# Patient Record
Sex: Male | Born: 1940
Health system: Southern US, Community
[De-identification: ages and names within clinical notes are randomized; demographics above are authoritative.]

## PROBLEM LIST (undated history)

## (undated) DIAGNOSIS — E119 Type 2 diabetes mellitus without complications: Secondary | ICD-10-CM

## (undated) DIAGNOSIS — N189 Chronic kidney disease, unspecified: Secondary | ICD-10-CM

## (undated) DIAGNOSIS — D649 Anemia, unspecified: Secondary | ICD-10-CM

## (undated) DIAGNOSIS — I10 Essential (primary) hypertension: Secondary | ICD-10-CM

---

## 2006-06-06 HISTORY — PX: SHOULDER ARTHROSCOPY: SHX128

## 2014-06-06 HISTORY — PX: KNEE ARTHROSCOPY: SUR90

## 2014-07-14 DIAGNOSIS — M1712 Unilateral primary osteoarthritis, left knee: Secondary | ICD-10-CM | POA: Diagnosis not present

## 2014-07-21 DIAGNOSIS — S83242A Other tear of medial meniscus, current injury, left knee, initial encounter: Secondary | ICD-10-CM | POA: Diagnosis not present

## 2014-08-06 DIAGNOSIS — Z6827 Body mass index (BMI) 27.0-27.9, adult: Secondary | ICD-10-CM | POA: Diagnosis not present

## 2014-08-06 DIAGNOSIS — Z79899 Other long term (current) drug therapy: Secondary | ICD-10-CM | POA: Diagnosis not present

## 2014-08-06 DIAGNOSIS — M23332 Other meniscus derangements, other medial meniscus, left knee: Secondary | ICD-10-CM | POA: Diagnosis not present

## 2014-08-06 DIAGNOSIS — M25562 Pain in left knee: Secondary | ICD-10-CM | POA: Diagnosis not present

## 2014-08-06 DIAGNOSIS — I10 Essential (primary) hypertension: Secondary | ICD-10-CM | POA: Diagnosis not present

## 2014-08-06 DIAGNOSIS — E119 Type 2 diabetes mellitus without complications: Secondary | ICD-10-CM | POA: Diagnosis not present

## 2014-08-15 DIAGNOSIS — Z6827 Body mass index (BMI) 27.0-27.9, adult: Secondary | ICD-10-CM | POA: Diagnosis not present

## 2014-08-15 DIAGNOSIS — Z79899 Other long term (current) drug therapy: Secondary | ICD-10-CM | POA: Diagnosis not present

## 2014-08-15 DIAGNOSIS — S83242A Other tear of medial meniscus, current injury, left knee, initial encounter: Secondary | ICD-10-CM | POA: Diagnosis not present

## 2014-08-15 DIAGNOSIS — M23332 Other meniscus derangements, other medial meniscus, left knee: Secondary | ICD-10-CM | POA: Diagnosis not present

## 2014-08-15 DIAGNOSIS — E119 Type 2 diabetes mellitus without complications: Secondary | ICD-10-CM | POA: Diagnosis not present

## 2014-08-15 DIAGNOSIS — M25562 Pain in left knee: Secondary | ICD-10-CM | POA: Diagnosis not present

## 2014-08-15 DIAGNOSIS — I10 Essential (primary) hypertension: Secondary | ICD-10-CM | POA: Diagnosis not present

## 2014-08-19 DIAGNOSIS — Z01810 Encounter for preprocedural cardiovascular examination: Secondary | ICD-10-CM | POA: Diagnosis not present

## 2014-08-19 DIAGNOSIS — I517 Cardiomegaly: Secondary | ICD-10-CM | POA: Diagnosis not present

## 2014-09-04 DIAGNOSIS — H04129 Dry eye syndrome of unspecified lacrimal gland: Secondary | ICD-10-CM | POA: Diagnosis not present

## 2014-09-04 DIAGNOSIS — E11319 Type 2 diabetes mellitus with unspecified diabetic retinopathy without macular edema: Secondary | ICD-10-CM | POA: Diagnosis not present

## 2014-09-04 DIAGNOSIS — H2510 Age-related nuclear cataract, unspecified eye: Secondary | ICD-10-CM | POA: Diagnosis not present

## 2014-09-08 DIAGNOSIS — J019 Acute sinusitis, unspecified: Secondary | ICD-10-CM | POA: Diagnosis not present

## 2014-09-08 DIAGNOSIS — J301 Allergic rhinitis due to pollen: Secondary | ICD-10-CM | POA: Diagnosis not present

## 2014-09-12 DIAGNOSIS — E119 Type 2 diabetes mellitus without complications: Secondary | ICD-10-CM | POA: Diagnosis not present

## 2014-09-12 DIAGNOSIS — D152 Benign neoplasm of mediastinum: Secondary | ICD-10-CM | POA: Diagnosis not present

## 2014-09-12 DIAGNOSIS — I10 Essential (primary) hypertension: Secondary | ICD-10-CM | POA: Diagnosis not present

## 2014-09-12 DIAGNOSIS — D640 Hereditary sideroblastic anemia: Secondary | ICD-10-CM | POA: Diagnosis not present

## 2014-09-12 DIAGNOSIS — E785 Hyperlipidemia, unspecified: Secondary | ICD-10-CM | POA: Diagnosis not present

## 2014-09-12 DIAGNOSIS — Z125 Encounter for screening for malignant neoplasm of prostate: Secondary | ICD-10-CM | POA: Diagnosis not present

## 2014-09-18 DIAGNOSIS — M179 Osteoarthritis of knee, unspecified: Secondary | ICD-10-CM | POA: Diagnosis not present

## 2014-09-18 DIAGNOSIS — Z6827 Body mass index (BMI) 27.0-27.9, adult: Secondary | ICD-10-CM | POA: Diagnosis not present

## 2014-09-18 DIAGNOSIS — Z1389 Encounter for screening for other disorder: Secondary | ICD-10-CM | POA: Diagnosis not present

## 2014-11-19 DIAGNOSIS — M25571 Pain in right ankle and joints of right foot: Secondary | ICD-10-CM | POA: Diagnosis not present

## 2014-12-30 DIAGNOSIS — L509 Urticaria, unspecified: Secondary | ICD-10-CM | POA: Diagnosis not present

## 2014-12-30 DIAGNOSIS — I10 Essential (primary) hypertension: Secondary | ICD-10-CM | POA: Diagnosis not present

## 2014-12-30 DIAGNOSIS — E119 Type 2 diabetes mellitus without complications: Secondary | ICD-10-CM | POA: Diagnosis not present

## 2014-12-30 DIAGNOSIS — E785 Hyperlipidemia, unspecified: Secondary | ICD-10-CM | POA: Diagnosis not present

## 2015-05-04 DIAGNOSIS — I10 Essential (primary) hypertension: Secondary | ICD-10-CM | POA: Diagnosis not present

## 2015-05-04 DIAGNOSIS — E119 Type 2 diabetes mellitus without complications: Secondary | ICD-10-CM | POA: Diagnosis not present

## 2015-05-04 DIAGNOSIS — E785 Hyperlipidemia, unspecified: Secondary | ICD-10-CM | POA: Diagnosis not present

## 2015-05-04 DIAGNOSIS — D649 Anemia, unspecified: Secondary | ICD-10-CM | POA: Diagnosis not present

## 2015-05-11 DIAGNOSIS — N183 Chronic kidney disease, stage 3 (moderate): Secondary | ICD-10-CM | POA: Diagnosis not present

## 2015-05-11 DIAGNOSIS — Z6828 Body mass index (BMI) 28.0-28.9, adult: Secondary | ICD-10-CM | POA: Diagnosis not present

## 2015-05-11 DIAGNOSIS — E119 Type 2 diabetes mellitus without complications: Secondary | ICD-10-CM | POA: Diagnosis not present

## 2015-05-11 DIAGNOSIS — M65819 Other synovitis and tenosynovitis, unspecified shoulder: Secondary | ICD-10-CM | POA: Diagnosis not present

## 2015-05-11 DIAGNOSIS — J069 Acute upper respiratory infection, unspecified: Secondary | ICD-10-CM | POA: Diagnosis not present

## 2015-05-11 DIAGNOSIS — D649 Anemia, unspecified: Secondary | ICD-10-CM | POA: Diagnosis not present

## 2015-05-11 DIAGNOSIS — E785 Hyperlipidemia, unspecified: Secondary | ICD-10-CM | POA: Diagnosis not present

## 2015-05-11 DIAGNOSIS — I10 Essential (primary) hypertension: Secondary | ICD-10-CM | POA: Diagnosis not present

## 2015-05-20 DIAGNOSIS — M19011 Primary osteoarthritis, right shoulder: Secondary | ICD-10-CM | POA: Diagnosis not present

## 2015-05-20 DIAGNOSIS — M4722 Other spondylosis with radiculopathy, cervical region: Secondary | ICD-10-CM | POA: Diagnosis not present

## 2015-05-26 DIAGNOSIS — N183 Chronic kidney disease, stage 3 (moderate): Secondary | ICD-10-CM | POA: Diagnosis not present

## 2015-05-27 DIAGNOSIS — M9971 Connective tissue and disc stenosis of intervertebral foramina of cervical region: Secondary | ICD-10-CM | POA: Diagnosis not present

## 2015-05-27 DIAGNOSIS — M4692 Unspecified inflammatory spondylopathy, cervical region: Secondary | ICD-10-CM | POA: Diagnosis not present

## 2015-05-27 DIAGNOSIS — M5021 Other cervical disc displacement,  high cervical region: Secondary | ICD-10-CM | POA: Diagnosis not present

## 2015-05-27 DIAGNOSIS — M5030 Other cervical disc degeneration, unspecified cervical region: Secondary | ICD-10-CM | POA: Diagnosis not present

## 2015-06-03 DIAGNOSIS — M19011 Primary osteoarthritis, right shoulder: Secondary | ICD-10-CM | POA: Diagnosis not present

## 2015-06-03 DIAGNOSIS — M4722 Other spondylosis with radiculopathy, cervical region: Secondary | ICD-10-CM | POA: Diagnosis not present

## 2015-06-11 DIAGNOSIS — Z23 Encounter for immunization: Secondary | ICD-10-CM | POA: Diagnosis not present

## 2015-06-25 DIAGNOSIS — M79601 Pain in right arm: Secondary | ICD-10-CM | POA: Diagnosis not present

## 2015-06-25 DIAGNOSIS — M542 Cervicalgia: Secondary | ICD-10-CM | POA: Diagnosis not present

## 2015-06-25 DIAGNOSIS — M25511 Pain in right shoulder: Secondary | ICD-10-CM | POA: Diagnosis not present

## 2015-06-25 DIAGNOSIS — M4802 Spinal stenosis, cervical region: Secondary | ICD-10-CM | POA: Diagnosis not present

## 2015-08-06 DIAGNOSIS — M79601 Pain in right arm: Secondary | ICD-10-CM | POA: Diagnosis not present

## 2015-08-06 DIAGNOSIS — M542 Cervicalgia: Secondary | ICD-10-CM | POA: Diagnosis not present

## 2015-08-06 DIAGNOSIS — M4802 Spinal stenosis, cervical region: Secondary | ICD-10-CM | POA: Diagnosis not present

## 2015-08-06 DIAGNOSIS — M25511 Pain in right shoulder: Secondary | ICD-10-CM | POA: Diagnosis not present

## 2015-08-13 DIAGNOSIS — M542 Cervicalgia: Secondary | ICD-10-CM | POA: Diagnosis not present

## 2015-08-13 DIAGNOSIS — M4710 Other spondylosis with myelopathy, site unspecified: Secondary | ICD-10-CM | POA: Diagnosis not present

## 2015-08-13 DIAGNOSIS — M47892 Other spondylosis, cervical region: Secondary | ICD-10-CM | POA: Diagnosis not present

## 2015-08-18 DIAGNOSIS — M47892 Other spondylosis, cervical region: Secondary | ICD-10-CM | POA: Diagnosis not present

## 2015-08-18 DIAGNOSIS — M542 Cervicalgia: Secondary | ICD-10-CM | POA: Diagnosis not present

## 2015-08-18 DIAGNOSIS — M4710 Other spondylosis with myelopathy, site unspecified: Secondary | ICD-10-CM | POA: Diagnosis not present

## 2015-08-20 DIAGNOSIS — M47892 Other spondylosis, cervical region: Secondary | ICD-10-CM | POA: Diagnosis not present

## 2015-08-20 DIAGNOSIS — M4710 Other spondylosis with myelopathy, site unspecified: Secondary | ICD-10-CM | POA: Diagnosis not present

## 2015-08-20 DIAGNOSIS — M542 Cervicalgia: Secondary | ICD-10-CM | POA: Diagnosis not present

## 2015-08-25 DIAGNOSIS — M47892 Other spondylosis, cervical region: Secondary | ICD-10-CM | POA: Diagnosis not present

## 2015-08-25 DIAGNOSIS — M4710 Other spondylosis with myelopathy, site unspecified: Secondary | ICD-10-CM | POA: Diagnosis not present

## 2015-08-25 DIAGNOSIS — M542 Cervicalgia: Secondary | ICD-10-CM | POA: Diagnosis not present

## 2015-08-27 DIAGNOSIS — M542 Cervicalgia: Secondary | ICD-10-CM | POA: Diagnosis not present

## 2015-08-27 DIAGNOSIS — M4710 Other spondylosis with myelopathy, site unspecified: Secondary | ICD-10-CM | POA: Diagnosis not present

## 2015-08-27 DIAGNOSIS — M47892 Other spondylosis, cervical region: Secondary | ICD-10-CM | POA: Diagnosis not present

## 2015-08-31 DIAGNOSIS — M4710 Other spondylosis with myelopathy, site unspecified: Secondary | ICD-10-CM | POA: Diagnosis not present

## 2015-08-31 DIAGNOSIS — M542 Cervicalgia: Secondary | ICD-10-CM | POA: Diagnosis not present

## 2015-08-31 DIAGNOSIS — M47892 Other spondylosis, cervical region: Secondary | ICD-10-CM | POA: Diagnosis not present

## 2015-09-04 DIAGNOSIS — R05 Cough: Secondary | ICD-10-CM | POA: Diagnosis not present

## 2015-09-04 DIAGNOSIS — J189 Pneumonia, unspecified organism: Secondary | ICD-10-CM | POA: Diagnosis not present

## 2015-09-10 DIAGNOSIS — R05 Cough: Secondary | ICD-10-CM | POA: Diagnosis not present

## 2016-03-15 DIAGNOSIS — Z23 Encounter for immunization: Secondary | ICD-10-CM | POA: Diagnosis not present

## 2016-04-16 DIAGNOSIS — M79641 Pain in right hand: Secondary | ICD-10-CM | POA: Diagnosis not present

## 2016-04-16 DIAGNOSIS — Y9353 Activity, golf: Secondary | ICD-10-CM | POA: Diagnosis not present

## 2016-08-05 DIAGNOSIS — D649 Anemia, unspecified: Secondary | ICD-10-CM | POA: Diagnosis not present

## 2016-08-05 DIAGNOSIS — N183 Chronic kidney disease, stage 3 (moderate): Secondary | ICD-10-CM | POA: Diagnosis not present

## 2016-08-12 DIAGNOSIS — Z6827 Body mass index (BMI) 27.0-27.9, adult: Secondary | ICD-10-CM | POA: Diagnosis not present

## 2016-08-12 DIAGNOSIS — N183 Chronic kidney disease, stage 3 (moderate): Secondary | ICD-10-CM | POA: Diagnosis not present

## 2016-08-12 DIAGNOSIS — I10 Essential (primary) hypertension: Secondary | ICD-10-CM | POA: Diagnosis not present

## 2016-08-12 DIAGNOSIS — E785 Hyperlipidemia, unspecified: Secondary | ICD-10-CM | POA: Diagnosis not present

## 2016-08-12 DIAGNOSIS — E1121 Type 2 diabetes mellitus with diabetic nephropathy: Secondary | ICD-10-CM | POA: Diagnosis not present

## 2016-08-12 DIAGNOSIS — N529 Male erectile dysfunction, unspecified: Secondary | ICD-10-CM | POA: Diagnosis not present

## 2016-08-12 DIAGNOSIS — Z0000 Encounter for general adult medical examination without abnormal findings: Secondary | ICD-10-CM | POA: Diagnosis not present

## 2016-08-15 DIAGNOSIS — D649 Anemia, unspecified: Secondary | ICD-10-CM | POA: Diagnosis not present

## 2016-08-15 DIAGNOSIS — E785 Hyperlipidemia, unspecified: Secondary | ICD-10-CM | POA: Diagnosis not present

## 2016-08-15 DIAGNOSIS — N183 Chronic kidney disease, stage 3 (moderate): Secondary | ICD-10-CM | POA: Diagnosis not present

## 2016-10-25 DIAGNOSIS — B029 Zoster without complications: Secondary | ICD-10-CM | POA: Diagnosis not present

## 2016-11-01 DIAGNOSIS — B029 Zoster without complications: Secondary | ICD-10-CM | POA: Diagnosis not present

## 2017-03-10 DIAGNOSIS — Z23 Encounter for immunization: Secondary | ICD-10-CM | POA: Diagnosis not present

## 2017-04-18 DIAGNOSIS — M109 Gout, unspecified: Secondary | ICD-10-CM | POA: Diagnosis not present

## 2017-05-15 DIAGNOSIS — M109 Gout, unspecified: Secondary | ICD-10-CM | POA: Diagnosis not present

## 2017-05-15 DIAGNOSIS — E785 Hyperlipidemia, unspecified: Secondary | ICD-10-CM | POA: Diagnosis not present

## 2017-05-15 DIAGNOSIS — E1165 Type 2 diabetes mellitus with hyperglycemia: Secondary | ICD-10-CM | POA: Diagnosis not present

## 2017-05-15 DIAGNOSIS — D649 Anemia, unspecified: Secondary | ICD-10-CM | POA: Diagnosis not present

## 2017-05-19 DIAGNOSIS — D573 Sickle-cell trait: Secondary | ICD-10-CM | POA: Diagnosis not present

## 2017-07-06 DIAGNOSIS — N250 Renal osteodystrophy: Secondary | ICD-10-CM | POA: Diagnosis not present

## 2017-07-06 DIAGNOSIS — D631 Anemia in chronic kidney disease: Secondary | ICD-10-CM | POA: Diagnosis not present

## 2017-07-06 DIAGNOSIS — N183 Chronic kidney disease, stage 3 (moderate): Secondary | ICD-10-CM | POA: Diagnosis not present

## 2017-07-06 DIAGNOSIS — M109 Gout, unspecified: Secondary | ICD-10-CM | POA: Diagnosis not present

## 2017-07-06 DIAGNOSIS — I10 Essential (primary) hypertension: Secondary | ICD-10-CM | POA: Diagnosis not present

## 2017-07-19 DIAGNOSIS — Z8371 Family history of colonic polyps: Secondary | ICD-10-CM | POA: Diagnosis not present

## 2017-07-19 DIAGNOSIS — Z1211 Encounter for screening for malignant neoplasm of colon: Secondary | ICD-10-CM | POA: Diagnosis not present

## 2017-07-19 DIAGNOSIS — K5790 Diverticulosis of intestine, part unspecified, without perforation or abscess without bleeding: Secondary | ICD-10-CM | POA: Diagnosis not present

## 2017-07-21 DIAGNOSIS — I120 Hypertensive chronic kidney disease with stage 5 chronic kidney disease or end stage renal disease: Secondary | ICD-10-CM | POA: Diagnosis not present

## 2017-07-21 DIAGNOSIS — N183 Chronic kidney disease, stage 3 (moderate): Secondary | ICD-10-CM | POA: Diagnosis not present

## 2017-07-21 DIAGNOSIS — N390 Urinary tract infection, site not specified: Secondary | ICD-10-CM | POA: Diagnosis not present

## 2017-08-03 DIAGNOSIS — D631 Anemia in chronic kidney disease: Secondary | ICD-10-CM | POA: Diagnosis not present

## 2017-08-03 DIAGNOSIS — N183 Chronic kidney disease, stage 3 (moderate): Secondary | ICD-10-CM | POA: Diagnosis not present

## 2017-08-03 DIAGNOSIS — I10 Essential (primary) hypertension: Secondary | ICD-10-CM | POA: Diagnosis not present

## 2017-08-03 DIAGNOSIS — M109 Gout, unspecified: Secondary | ICD-10-CM | POA: Diagnosis not present

## 2017-08-03 DIAGNOSIS — N250 Renal osteodystrophy: Secondary | ICD-10-CM | POA: Diagnosis not present

## 2017-08-08 DIAGNOSIS — Z8371 Family history of colonic polyps: Secondary | ICD-10-CM | POA: Diagnosis not present

## 2017-08-08 DIAGNOSIS — K5730 Diverticulosis of large intestine without perforation or abscess without bleeding: Secondary | ICD-10-CM | POA: Diagnosis not present

## 2017-08-08 DIAGNOSIS — Z1211 Encounter for screening for malignant neoplasm of colon: Secondary | ICD-10-CM | POA: Diagnosis not present

## 2017-08-08 DIAGNOSIS — K5732 Diverticulitis of large intestine without perforation or abscess without bleeding: Secondary | ICD-10-CM | POA: Diagnosis not present

## 2017-08-08 DIAGNOSIS — D128 Benign neoplasm of rectum: Secondary | ICD-10-CM | POA: Diagnosis not present

## 2017-08-08 DIAGNOSIS — K635 Polyp of colon: Secondary | ICD-10-CM | POA: Diagnosis not present

## 2017-08-08 DIAGNOSIS — K621 Rectal polyp: Secondary | ICD-10-CM | POA: Diagnosis not present

## 2017-08-08 DIAGNOSIS — K611 Rectal abscess: Secondary | ICD-10-CM | POA: Diagnosis not present

## 2017-08-08 DIAGNOSIS — Z800 Family history of malignant neoplasm of digestive organs: Secondary | ICD-10-CM | POA: Diagnosis not present

## 2017-12-01 DIAGNOSIS — J4540 Moderate persistent asthma, uncomplicated: Secondary | ICD-10-CM | POA: Diagnosis not present

## 2018-01-09 DIAGNOSIS — E119 Type 2 diabetes mellitus without complications: Secondary | ICD-10-CM | POA: Diagnosis not present

## 2018-01-09 DIAGNOSIS — Z7984 Long term (current) use of oral hypoglycemic drugs: Secondary | ICD-10-CM | POA: Diagnosis not present

## 2018-01-09 DIAGNOSIS — E785 Hyperlipidemia, unspecified: Secondary | ICD-10-CM | POA: Diagnosis not present

## 2018-01-09 DIAGNOSIS — J4540 Moderate persistent asthma, uncomplicated: Secondary | ICD-10-CM | POA: Diagnosis not present

## 2018-01-09 DIAGNOSIS — I10 Essential (primary) hypertension: Secondary | ICD-10-CM | POA: Diagnosis not present

## 2018-01-10 DIAGNOSIS — E785 Hyperlipidemia, unspecified: Secondary | ICD-10-CM | POA: Diagnosis not present

## 2018-01-10 DIAGNOSIS — J4540 Moderate persistent asthma, uncomplicated: Secondary | ICD-10-CM | POA: Diagnosis not present

## 2018-01-10 DIAGNOSIS — I10 Essential (primary) hypertension: Secondary | ICD-10-CM | POA: Diagnosis not present

## 2018-01-10 DIAGNOSIS — E119 Type 2 diabetes mellitus without complications: Secondary | ICD-10-CM | POA: Diagnosis not present

## 2018-02-22 DIAGNOSIS — Z23 Encounter for immunization: Secondary | ICD-10-CM | POA: Diagnosis not present

## 2018-02-22 DIAGNOSIS — J302 Other seasonal allergic rhinitis: Secondary | ICD-10-CM | POA: Diagnosis not present

## 2018-02-22 DIAGNOSIS — E785 Hyperlipidemia, unspecified: Secondary | ICD-10-CM | POA: Diagnosis not present

## 2018-02-22 DIAGNOSIS — J4540 Moderate persistent asthma, uncomplicated: Secondary | ICD-10-CM | POA: Diagnosis not present

## 2018-02-22 DIAGNOSIS — E119 Type 2 diabetes mellitus without complications: Secondary | ICD-10-CM | POA: Diagnosis not present

## 2018-02-22 DIAGNOSIS — N183 Chronic kidney disease, stage 3 (moderate): Secondary | ICD-10-CM | POA: Diagnosis not present

## 2018-02-22 DIAGNOSIS — R6889 Other general symptoms and signs: Secondary | ICD-10-CM | POA: Diagnosis not present

## 2018-02-22 DIAGNOSIS — I10 Essential (primary) hypertension: Secondary | ICD-10-CM | POA: Diagnosis not present

## 2018-04-19 DIAGNOSIS — N529 Male erectile dysfunction, unspecified: Secondary | ICD-10-CM | POA: Diagnosis not present

## 2018-04-19 DIAGNOSIS — E785 Hyperlipidemia, unspecified: Secondary | ICD-10-CM | POA: Diagnosis not present

## 2018-04-19 DIAGNOSIS — R143 Flatulence: Secondary | ICD-10-CM | POA: Diagnosis not present

## 2018-04-19 DIAGNOSIS — M25511 Pain in right shoulder: Secondary | ICD-10-CM | POA: Diagnosis not present

## 2018-04-19 DIAGNOSIS — Z6827 Body mass index (BMI) 27.0-27.9, adult: Secondary | ICD-10-CM | POA: Diagnosis not present

## 2018-04-19 DIAGNOSIS — D573 Sickle-cell trait: Secondary | ICD-10-CM | POA: Diagnosis not present

## 2018-05-04 DIAGNOSIS — N183 Chronic kidney disease, stage 3 (moderate): Secondary | ICD-10-CM | POA: Diagnosis not present

## 2018-05-04 DIAGNOSIS — N390 Urinary tract infection, site not specified: Secondary | ICD-10-CM | POA: Diagnosis not present

## 2018-05-04 DIAGNOSIS — I120 Hypertensive chronic kidney disease with stage 5 chronic kidney disease or end stage renal disease: Secondary | ICD-10-CM | POA: Diagnosis not present

## 2018-05-09 DIAGNOSIS — N183 Chronic kidney disease, stage 3 (moderate): Secondary | ICD-10-CM | POA: Diagnosis not present

## 2018-05-09 DIAGNOSIS — M109 Gout, unspecified: Secondary | ICD-10-CM | POA: Diagnosis not present

## 2018-05-09 DIAGNOSIS — N250 Renal osteodystrophy: Secondary | ICD-10-CM | POA: Diagnosis not present

## 2018-05-09 DIAGNOSIS — D631 Anemia in chronic kidney disease: Secondary | ICD-10-CM | POA: Diagnosis not present

## 2018-05-09 DIAGNOSIS — I10 Essential (primary) hypertension: Secondary | ICD-10-CM | POA: Diagnosis not present

## 2018-05-22 DIAGNOSIS — H2513 Age-related nuclear cataract, bilateral: Secondary | ICD-10-CM | POA: Diagnosis not present

## 2018-05-22 DIAGNOSIS — H11153 Pinguecula, bilateral: Secondary | ICD-10-CM | POA: Diagnosis not present

## 2018-05-22 DIAGNOSIS — E113293 Type 2 diabetes mellitus with mild nonproliferative diabetic retinopathy without macular edema, bilateral: Secondary | ICD-10-CM | POA: Diagnosis not present

## 2018-08-16 DIAGNOSIS — E1165 Type 2 diabetes mellitus with hyperglycemia: Secondary | ICD-10-CM | POA: Diagnosis not present

## 2018-08-16 DIAGNOSIS — I10 Essential (primary) hypertension: Secondary | ICD-10-CM | POA: Diagnosis not present

## 2018-08-16 DIAGNOSIS — E785 Hyperlipidemia, unspecified: Secondary | ICD-10-CM | POA: Diagnosis not present

## 2018-08-16 DIAGNOSIS — E559 Vitamin D deficiency, unspecified: Secondary | ICD-10-CM | POA: Diagnosis not present

## 2018-08-16 DIAGNOSIS — N183 Chronic kidney disease, stage 3 (moderate): Secondary | ICD-10-CM | POA: Diagnosis not present

## 2018-08-18 DIAGNOSIS — K1120 Sialoadenitis, unspecified: Secondary | ICD-10-CM | POA: Diagnosis not present

## 2018-08-18 DIAGNOSIS — K0889 Other specified disorders of teeth and supporting structures: Secondary | ICD-10-CM | POA: Diagnosis not present

## 2018-08-21 DIAGNOSIS — Z1331 Encounter for screening for depression: Secondary | ICD-10-CM | POA: Diagnosis not present

## 2018-08-21 DIAGNOSIS — I10 Essential (primary) hypertension: Secondary | ICD-10-CM | POA: Diagnosis not present

## 2018-08-21 DIAGNOSIS — N183 Chronic kidney disease, stage 3 (moderate): Secondary | ICD-10-CM | POA: Diagnosis not present

## 2018-08-21 DIAGNOSIS — E1121 Type 2 diabetes mellitus with diabetic nephropathy: Secondary | ICD-10-CM | POA: Diagnosis not present

## 2018-08-21 DIAGNOSIS — J3489 Other specified disorders of nose and nasal sinuses: Secondary | ICD-10-CM | POA: Diagnosis not present

## 2018-08-21 DIAGNOSIS — Z6827 Body mass index (BMI) 27.0-27.9, adult: Secondary | ICD-10-CM | POA: Diagnosis not present

## 2018-08-30 DIAGNOSIS — N183 Chronic kidney disease, stage 3 (moderate): Secondary | ICD-10-CM | POA: Diagnosis not present

## 2018-08-30 DIAGNOSIS — N529 Male erectile dysfunction, unspecified: Secondary | ICD-10-CM | POA: Diagnosis not present

## 2018-08-30 DIAGNOSIS — I10 Essential (primary) hypertension: Secondary | ICD-10-CM | POA: Diagnosis not present

## 2018-08-30 DIAGNOSIS — E119 Type 2 diabetes mellitus without complications: Secondary | ICD-10-CM | POA: Diagnosis not present

## 2018-08-30 DIAGNOSIS — E785 Hyperlipidemia, unspecified: Secondary | ICD-10-CM | POA: Diagnosis not present

## 2018-08-30 DIAGNOSIS — M109 Gout, unspecified: Secondary | ICD-10-CM | POA: Diagnosis not present

## 2019-02-15 DIAGNOSIS — Z23 Encounter for immunization: Secondary | ICD-10-CM | POA: Diagnosis not present

## 2019-03-08 DIAGNOSIS — E785 Hyperlipidemia, unspecified: Secondary | ICD-10-CM | POA: Diagnosis not present

## 2019-03-08 DIAGNOSIS — E119 Type 2 diabetes mellitus without complications: Secondary | ICD-10-CM | POA: Diagnosis not present

## 2019-03-08 DIAGNOSIS — Z125 Encounter for screening for malignant neoplasm of prostate: Secondary | ICD-10-CM | POA: Diagnosis not present

## 2019-03-08 DIAGNOSIS — Z1211 Encounter for screening for malignant neoplasm of colon: Secondary | ICD-10-CM | POA: Diagnosis not present

## 2019-03-08 DIAGNOSIS — J4540 Moderate persistent asthma, uncomplicated: Secondary | ICD-10-CM | POA: Diagnosis not present

## 2019-03-08 DIAGNOSIS — I10 Essential (primary) hypertension: Secondary | ICD-10-CM | POA: Diagnosis not present

## 2019-03-08 DIAGNOSIS — N183 Chronic kidney disease, stage 3 unspecified: Secondary | ICD-10-CM | POA: Diagnosis not present

## 2019-03-08 DIAGNOSIS — N529 Male erectile dysfunction, unspecified: Secondary | ICD-10-CM | POA: Diagnosis not present

## 2019-03-08 DIAGNOSIS — M109 Gout, unspecified: Secondary | ICD-10-CM | POA: Diagnosis not present

## 2019-03-12 DIAGNOSIS — M109 Gout, unspecified: Secondary | ICD-10-CM | POA: Diagnosis not present

## 2019-03-12 DIAGNOSIS — Z125 Encounter for screening for malignant neoplasm of prostate: Secondary | ICD-10-CM | POA: Diagnosis not present

## 2019-03-12 DIAGNOSIS — N183 Chronic kidney disease, stage 3 unspecified: Secondary | ICD-10-CM | POA: Diagnosis not present

## 2019-03-12 DIAGNOSIS — E119 Type 2 diabetes mellitus without complications: Secondary | ICD-10-CM | POA: Diagnosis not present

## 2019-03-12 DIAGNOSIS — E785 Hyperlipidemia, unspecified: Secondary | ICD-10-CM | POA: Diagnosis not present

## 2019-03-12 DIAGNOSIS — Z1211 Encounter for screening for malignant neoplasm of colon: Secondary | ICD-10-CM | POA: Diagnosis not present

## 2019-03-12 DIAGNOSIS — N529 Male erectile dysfunction, unspecified: Secondary | ICD-10-CM | POA: Diagnosis not present

## 2019-03-12 DIAGNOSIS — I10 Essential (primary) hypertension: Secondary | ICD-10-CM | POA: Diagnosis not present

## 2019-03-12 DIAGNOSIS — J4540 Moderate persistent asthma, uncomplicated: Secondary | ICD-10-CM | POA: Diagnosis not present

## 2019-03-18 DIAGNOSIS — E785 Hyperlipidemia, unspecified: Secondary | ICD-10-CM | POA: Diagnosis not present

## 2019-03-18 DIAGNOSIS — N183 Chronic kidney disease, stage 3 unspecified: Secondary | ICD-10-CM | POA: Diagnosis not present

## 2019-03-18 DIAGNOSIS — I10 Essential (primary) hypertension: Secondary | ICD-10-CM | POA: Diagnosis not present

## 2019-03-18 DIAGNOSIS — R972 Elevated prostate specific antigen [PSA]: Secondary | ICD-10-CM | POA: Diagnosis not present

## 2019-03-18 DIAGNOSIS — E119 Type 2 diabetes mellitus without complications: Secondary | ICD-10-CM | POA: Diagnosis not present

## 2019-03-18 DIAGNOSIS — M109 Gout, unspecified: Secondary | ICD-10-CM | POA: Diagnosis not present

## 2019-06-23 ENCOUNTER — Ambulatory Visit: Payer: Medicare Other | Attending: Internal Medicine

## 2019-06-23 DIAGNOSIS — Z23 Encounter for immunization: Secondary | ICD-10-CM | POA: Diagnosis not present

## 2019-06-23 NOTE — Progress Notes (Signed)
   Covid-19 Vaccination Clinic  Name:  Chase Riley    MRN: [id number] DOB: 11-09-1940  06/23/2019  Chase Riley was observed post Covid-19 immunization for 15 minutes without incidence. He was provided with Vaccine Information Sheet and instruction to access the V-Safe system.   Chase Riley was instructed to call 911 with any severe reactions post vaccine: Marland Kitchen Difficulty breathing  . Swelling of your face and throat  . A fast heartbeat  . A bad rash all over your body  . Dizziness and weakness

## 2019-07-14 ENCOUNTER — Ambulatory Visit: Payer: Medicare Other | Attending: Internal Medicine

## 2019-07-14 DIAGNOSIS — Z23 Encounter for immunization: Secondary | ICD-10-CM

## 2019-07-14 NOTE — Progress Notes (Signed)
   Covid-19 Vaccination Clinic  Name:  Chase Riley    MRN: [id number] DOB: 1940/08/15  07/14/2019  [name] was observed post Covid-19 immunization for 15 minutes without incidence. He was provided with Vaccine Information Sheet and instruction to access the V-Safe system.   [name] was instructed to call 911 with any severe reactions post vaccine: Marland Kitchen Difficulty breathing  . Swelling of your face and throat  . A fast heartbeat  . A bad rash all over your body  . Dizziness and weakness    Immunizations Administered    Name Date Dose VIS Date Route   Pfizer COVID-19 Vaccine 07/14/2019 11:54 AM 0.3 mL 05/17/2019 Intramuscular   Manufacturer: [place]   Lot: [id number]   Aneta: [id number]

## 2019-09-23 DIAGNOSIS — I10 Essential (primary) hypertension: Secondary | ICD-10-CM | POA: Diagnosis not present

## 2019-09-23 DIAGNOSIS — E785 Hyperlipidemia, unspecified: Secondary | ICD-10-CM | POA: Diagnosis not present

## 2019-09-23 DIAGNOSIS — R143 Flatulence: Secondary | ICD-10-CM | POA: Diagnosis not present

## 2019-09-23 DIAGNOSIS — N183 Chronic kidney disease, stage 3 unspecified: Secondary | ICD-10-CM | POA: Diagnosis not present

## 2019-09-23 DIAGNOSIS — R972 Elevated prostate specific antigen [PSA]: Secondary | ICD-10-CM | POA: Diagnosis not present

## 2019-09-23 DIAGNOSIS — E119 Type 2 diabetes mellitus without complications: Secondary | ICD-10-CM | POA: Diagnosis not present

## 2019-09-23 DIAGNOSIS — N1831 Chronic kidney disease, stage 3a: Secondary | ICD-10-CM | POA: Diagnosis not present

## 2019-09-23 DIAGNOSIS — M109 Gout, unspecified: Secondary | ICD-10-CM | POA: Diagnosis not present

## 2019-10-23 DIAGNOSIS — R972 Elevated prostate specific antigen [PSA]: Secondary | ICD-10-CM | POA: Diagnosis not present

## 2019-11-07 DIAGNOSIS — E785 Hyperlipidemia, unspecified: Secondary | ICD-10-CM | POA: Diagnosis not present

## 2019-11-07 DIAGNOSIS — I10 Essential (primary) hypertension: Secondary | ICD-10-CM | POA: Diagnosis not present

## 2019-11-07 DIAGNOSIS — M109 Gout, unspecified: Secondary | ICD-10-CM | POA: Diagnosis not present

## 2019-11-07 DIAGNOSIS — Z7984 Long term (current) use of oral hypoglycemic drugs: Secondary | ICD-10-CM | POA: Diagnosis not present

## 2019-11-07 DIAGNOSIS — N1831 Chronic kidney disease, stage 3a: Secondary | ICD-10-CM | POA: Diagnosis not present

## 2019-11-07 DIAGNOSIS — E1122 Type 2 diabetes mellitus with diabetic chronic kidney disease: Secondary | ICD-10-CM | POA: Diagnosis not present

## 2019-11-07 DIAGNOSIS — R972 Elevated prostate specific antigen [PSA]: Secondary | ICD-10-CM | POA: Diagnosis not present

## 2020-02-04 DIAGNOSIS — N1831 Chronic kidney disease, stage 3a: Secondary | ICD-10-CM | POA: Diagnosis not present

## 2020-02-04 DIAGNOSIS — I10 Essential (primary) hypertension: Secondary | ICD-10-CM | POA: Diagnosis not present

## 2020-02-04 DIAGNOSIS — Z7984 Long term (current) use of oral hypoglycemic drugs: Secondary | ICD-10-CM | POA: Diagnosis not present

## 2020-02-04 DIAGNOSIS — M109 Gout, unspecified: Secondary | ICD-10-CM | POA: Diagnosis not present

## 2020-02-04 DIAGNOSIS — E785 Hyperlipidemia, unspecified: Secondary | ICD-10-CM | POA: Diagnosis not present

## 2020-02-04 DIAGNOSIS — E1122 Type 2 diabetes mellitus with diabetic chronic kidney disease: Secondary | ICD-10-CM | POA: Diagnosis not present

## 2020-02-04 DIAGNOSIS — R972 Elevated prostate specific antigen [PSA]: Secondary | ICD-10-CM | POA: Diagnosis not present

## 2020-02-13 DIAGNOSIS — Z23 Encounter for immunization: Secondary | ICD-10-CM | POA: Diagnosis not present

## 2020-02-26 DIAGNOSIS — R972 Elevated prostate specific antigen [PSA]: Secondary | ICD-10-CM | POA: Diagnosis not present

## 2020-02-28 DIAGNOSIS — Z23 Encounter for immunization: Secondary | ICD-10-CM | POA: Diagnosis not present

## 2020-03-04 DIAGNOSIS — N5201 Erectile dysfunction due to arterial insufficiency: Secondary | ICD-10-CM | POA: Diagnosis not present

## 2020-03-04 DIAGNOSIS — R972 Elevated prostate specific antigen [PSA]: Secondary | ICD-10-CM | POA: Diagnosis not present

## 2020-12-14 DIAGNOSIS — Z7984 Long term (current) use of oral hypoglycemic drugs: Secondary | ICD-10-CM | POA: Diagnosis not present

## 2020-12-14 DIAGNOSIS — R972 Elevated prostate specific antigen [PSA]: Secondary | ICD-10-CM | POA: Diagnosis not present

## 2020-12-14 DIAGNOSIS — E1122 Type 2 diabetes mellitus with diabetic chronic kidney disease: Secondary | ICD-10-CM | POA: Diagnosis not present

## 2020-12-14 DIAGNOSIS — I889 Nonspecific lymphadenitis, unspecified: Secondary | ICD-10-CM | POA: Diagnosis not present

## 2020-12-14 DIAGNOSIS — E785 Hyperlipidemia, unspecified: Secondary | ICD-10-CM | POA: Diagnosis not present

## 2020-12-14 DIAGNOSIS — M109 Gout, unspecified: Secondary | ICD-10-CM | POA: Diagnosis not present

## 2020-12-14 DIAGNOSIS — I10 Essential (primary) hypertension: Secondary | ICD-10-CM | POA: Diagnosis not present

## 2021-01-01 DIAGNOSIS — U071 COVID-19: Secondary | ICD-10-CM | POA: Diagnosis not present

## 2021-01-12 DIAGNOSIS — L02411 Cutaneous abscess of right axilla: Secondary | ICD-10-CM | POA: Diagnosis not present

## 2021-01-14 DIAGNOSIS — L02411 Cutaneous abscess of right axilla: Secondary | ICD-10-CM | POA: Diagnosis not present

## 2021-01-22 DIAGNOSIS — E1122 Type 2 diabetes mellitus with diabetic chronic kidney disease: Secondary | ICD-10-CM | POA: Diagnosis not present

## 2021-01-22 DIAGNOSIS — L02411 Cutaneous abscess of right axilla: Secondary | ICD-10-CM | POA: Diagnosis not present

## 2021-03-05 DIAGNOSIS — R972 Elevated prostate specific antigen [PSA]: Secondary | ICD-10-CM | POA: Diagnosis not present

## 2021-03-09 DIAGNOSIS — N5201 Erectile dysfunction due to arterial insufficiency: Secondary | ICD-10-CM | POA: Diagnosis not present

## 2021-03-09 DIAGNOSIS — R972 Elevated prostate specific antigen [PSA]: Secondary | ICD-10-CM | POA: Diagnosis not present

## 2021-03-23 DIAGNOSIS — I10 Essential (primary) hypertension: Secondary | ICD-10-CM | POA: Diagnosis not present

## 2021-03-23 DIAGNOSIS — N1831 Chronic kidney disease, stage 3a: Secondary | ICD-10-CM | POA: Diagnosis not present

## 2021-03-23 DIAGNOSIS — E1122 Type 2 diabetes mellitus with diabetic chronic kidney disease: Secondary | ICD-10-CM | POA: Diagnosis not present

## 2021-03-23 DIAGNOSIS — M109 Gout, unspecified: Secondary | ICD-10-CM | POA: Diagnosis not present

## 2021-04-27 DIAGNOSIS — Z23 Encounter for immunization: Secondary | ICD-10-CM | POA: Diagnosis not present

## 2021-09-20 ENCOUNTER — Ambulatory Visit
Admission: RE | Admit: 2021-09-20 | Discharge: 2021-09-20 | Disposition: A | Discharge status: Home / Self Care | Payer: Medicare Other | Source: Ambulatory Visit | Attending: Family Medicine | Admitting: Family Medicine

## 2021-09-20 ENCOUNTER — Other Ambulatory Visit: Payer: Self-pay | Admitting: Family Medicine

## 2021-09-20 DIAGNOSIS — R053 Chronic cough: Secondary | ICD-10-CM | POA: Diagnosis not present

## 2021-09-20 DIAGNOSIS — R143 Flatulence: Secondary | ICD-10-CM | POA: Diagnosis not present

## 2021-09-20 DIAGNOSIS — R059 Cough, unspecified: Secondary | ICD-10-CM

## 2021-09-20 DIAGNOSIS — N1831 Chronic kidney disease, stage 3a: Secondary | ICD-10-CM | POA: Diagnosis not present

## 2021-09-20 DIAGNOSIS — I10 Essential (primary) hypertension: Secondary | ICD-10-CM | POA: Diagnosis not present

## 2021-09-20 DIAGNOSIS — R058 Other specified cough: Secondary | ICD-10-CM | POA: Diagnosis not present

## 2021-09-20 DIAGNOSIS — M109 Gout, unspecified: Secondary | ICD-10-CM | POA: Diagnosis not present

## 2021-09-20 DIAGNOSIS — E1122 Type 2 diabetes mellitus with diabetic chronic kidney disease: Secondary | ICD-10-CM | POA: Diagnosis not present

## 2021-09-23 ENCOUNTER — Other Ambulatory Visit: Payer: Self-pay | Admitting: Family Medicine

## 2021-09-23 DIAGNOSIS — R748 Abnormal levels of other serum enzymes: Secondary | ICD-10-CM

## 2021-09-27 ENCOUNTER — Ambulatory Visit
Admission: RE | Admit: 2021-09-27 | Discharge: 2021-09-27 | Disposition: A | Discharge status: Home / Self Care | Payer: Medicare Other | Source: Ambulatory Visit | Attending: Family Medicine | Admitting: Family Medicine

## 2021-09-27 DIAGNOSIS — R748 Abnormal levels of other serum enzymes: Secondary | ICD-10-CM

## 2021-10-04 ENCOUNTER — Other Ambulatory Visit: Payer: Self-pay | Admitting: Family Medicine

## 2021-10-04 DIAGNOSIS — R899 Unspecified abnormal finding in specimens from other organs, systems and tissues: Secondary | ICD-10-CM

## 2021-10-29 ENCOUNTER — Ambulatory Visit
Admission: RE | Admit: 2021-10-29 | Discharge: 2021-10-29 | Disposition: A | Discharge status: Home / Self Care | Payer: Medicare Other | Source: Ambulatory Visit | Attending: Family Medicine | Admitting: Family Medicine

## 2021-10-29 DIAGNOSIS — K5730 Diverticulosis of large intestine without perforation or abscess without bleeding: Secondary | ICD-10-CM | POA: Diagnosis not present

## 2021-10-29 DIAGNOSIS — N3289 Other specified disorders of bladder: Secondary | ICD-10-CM | POA: Diagnosis not present

## 2021-10-29 DIAGNOSIS — R899 Unspecified abnormal finding in specimens from other organs, systems and tissues: Secondary | ICD-10-CM

## 2021-10-29 DIAGNOSIS — K4090 Unilateral inguinal hernia, without obstruction or gangrene, not specified as recurrent: Secondary | ICD-10-CM | POA: Diagnosis not present

## 2021-10-29 DIAGNOSIS — N400 Enlarged prostate without lower urinary tract symptoms: Secondary | ICD-10-CM | POA: Diagnosis not present

## 2021-10-29 MED ORDER — IOPAMIDOL (ISOVUE-300) INJECTION 61%
100.0000 mL | Freq: Once | INTRAVENOUS | Status: AC | PRN
  Administered 2021-10-29: 100 mL via INTRAVENOUS

## 2021-11-17 DIAGNOSIS — Z23 Encounter for immunization: Secondary | ICD-10-CM | POA: Diagnosis not present

## 2021-12-06 ENCOUNTER — Encounter (HOSPITAL_BASED_OUTPATIENT_CLINIC_OR_DEPARTMENT_OTHER): Payer: Self-pay

## 2021-12-06 ENCOUNTER — Other Ambulatory Visit: Payer: Self-pay

## 2021-12-06 DIAGNOSIS — M79662 Pain in left lower leg: Secondary | ICD-10-CM | POA: Diagnosis not present

## 2021-12-06 DIAGNOSIS — M25552 Pain in left hip: Secondary | ICD-10-CM | POA: Diagnosis not present

## 2021-12-06 DIAGNOSIS — M549 Dorsalgia, unspecified: Secondary | ICD-10-CM | POA: Diagnosis not present

## 2021-12-06 DIAGNOSIS — M546 Pain in thoracic spine: Secondary | ICD-10-CM | POA: Diagnosis not present

## 2021-12-06 DIAGNOSIS — M79605 Pain in left leg: Secondary | ICD-10-CM | POA: Insufficient documentation

## 2021-12-06 NOTE — ED Triage Notes (Addendum)
Patient here POV from Home.  Endorses Pain to Posterior Neck that began 4-5 Days PTA. Since then the Pain has begun to radiate from Posterior neck down Posterior Back to Left Hip and down to Left Calf.   No Known Trauma or Injury. No N/V/D. No Fevers. Constant Pain. Pulses Palpable Equally to all Distal Extremities.   Uncomfortable during Triage. A&Ox4. GCS 15. BIB Wheelchair.

## 2021-12-07 ENCOUNTER — Emergency Department (HOSPITAL_BASED_OUTPATIENT_CLINIC_OR_DEPARTMENT_OTHER)
Admission: EM | Admit: 2021-12-07 | Discharge: 2021-12-07 | Disposition: A | Discharge status: Home / Self Care | Payer: Medicare Other | Attending: Emergency Medicine | Admitting: Emergency Medicine

## 2021-12-07 ENCOUNTER — Emergency Department (HOSPITAL_BASED_OUTPATIENT_CLINIC_OR_DEPARTMENT_OTHER): Payer: Medicare Other

## 2021-12-07 DIAGNOSIS — M549 Dorsalgia, unspecified: Secondary | ICD-10-CM | POA: Diagnosis not present

## 2021-12-07 DIAGNOSIS — M79605 Pain in left leg: Secondary | ICD-10-CM

## 2021-12-07 HISTORY — DX: Type 2 diabetes mellitus without complications: E11.9

## 2021-12-07 HISTORY — DX: Essential (primary) hypertension: I10

## 2021-12-07 LAB — CBC WITH DIFFERENTIAL/PLATELET
Abs Immature Granulocytes: 0.02 10*3/uL (ref 0.00–0.07)
Basophils Absolute: 0 10*3/uL (ref 0.0–0.1)
Basophils Relative: 0 %
Eosinophils Absolute: 0.1 10*3/uL (ref 0.0–0.5)
Eosinophils Relative: 1 %
HCT: 36.9 % — ABNORMAL LOW (ref 39.0–52.0)
Hemoglobin: 11.9 g/dL — ABNORMAL LOW (ref 13.0–17.0)
Immature Granulocytes: 0 %
Lymphocytes Relative: 23 %
Lymphs Abs: 2.2 10*3/uL (ref 0.7–4.0)
MCH: 28.4 pg (ref 26.0–34.0)
MCHC: 32.2 g/dL (ref 30.0–36.0)
MCV: 88.1 fL (ref 80.0–100.0)
Monocytes Absolute: 0.7 10*3/uL (ref 0.1–1.0)
Monocytes Relative: 7 %
Neutro Abs: 6.4 10*3/uL (ref 1.7–7.7)
Neutrophils Relative %: 69 %
Platelets: 295 10*3/uL (ref 150–400)
RBC: 4.19 MIL/uL — ABNORMAL LOW (ref 4.22–5.81)
RDW: 14 % (ref 11.5–15.5)
WBC: 9.3 10*3/uL (ref 4.0–10.5)
nRBC: 0 % (ref 0.0–0.2)

## 2021-12-07 LAB — BASIC METABOLIC PANEL
Anion gap: 13 (ref 5–15)
BUN: 21 mg/dL (ref 8–23)
CO2: 27 mmol/L (ref 22–32)
Calcium: 10.8 mg/dL — ABNORMAL HIGH (ref 8.9–10.3)
Chloride: 104 mmol/L (ref 98–111)
Creatinine, Ser: 1.64 mg/dL — ABNORMAL HIGH (ref 0.61–1.24)
GFR, Estimated: 42 mL/min — ABNORMAL LOW (ref >60)
Glucose, Bld: 115 mg/dL — ABNORMAL HIGH (ref 70–99)
Potassium: 3.8 mmol/L (ref 3.5–5.1)
Sodium: 144 mmol/L (ref 135–145)

## 2021-12-07 MED ORDER — KETOROLAC TROMETHAMINE 15 MG/ML IJ SOLN
15.0000 mg | Freq: Once | INTRAMUSCULAR | Status: AC
  Administered 2021-12-07: 15 mg via INTRAVENOUS
  Filled 2021-12-07: qty 1

## 2021-12-07 MED ORDER — DEXAMETHASONE SODIUM PHOSPHATE 10 MG/ML IJ SOLN
10.0000 mg | Freq: Once | INTRAMUSCULAR | Status: AC
  Administered 2021-12-07: 10 mg via INTRAVENOUS
  Filled 2021-12-07: qty 1

## 2021-12-07 MED ORDER — ACETAMINOPHEN 500 MG PO TABS
1000.0000 mg | ORAL_TABLET | Freq: Once | ORAL | Status: AC
Start: 2021-12-07 — End: 2021-12-07
  Administered 2021-12-07: 1000 mg via ORAL
  Filled 2021-12-07: qty 2

## 2021-12-07 MED ORDER — METHOCARBAMOL 1000 MG/10ML IJ SOLN
1000.0000 mg | Freq: Once | INTRAMUSCULAR | Status: AC
  Administered 2021-12-07: 1000 mg via INTRAVENOUS
  Filled 2021-12-07: qty 10

## 2021-12-07 MED ORDER — METHOCARBAMOL 500 MG PO TABS: 1000.0000 mg | ORAL_TABLET | Freq: Two times a day (BID) | ORAL | 0 refills | Status: DC

## 2021-12-07 NOTE — ED Notes (Signed)
Pt verbalizes understanding of discharge instructions. Opportunity for questioning and answers were provided. Pt discharged from ED to home with wife.    

## 2021-12-07 NOTE — Discharge Instructions (Signed)
You may use over-the-counter Acetaminophen (Tylenol), topical muscle creams such as SalonPas, [employer], Bengay, etc. Please stretch, apply ice or heat (whichever helps), and have massage therapy for additional assistance.  For pain control you may take at 1000 mg of Tylenol every 8 hours as needed.

## 2021-12-07 NOTE — ED Provider Notes (Signed)
[facility] EMERGENCY DEPT Provider Note  CSN: [id number] Arrival date & time: 12/06/21 2048  Chief Complaint(s) Neck Pain  HPI Chase Riley is a 81 y.o. male {Add pertinent medical, surgical, social history, OB history to HPI:1}    Neck Pain   Past Medical History Past Medical History:  Diagnosis Date   Diabetes mellitus without complication ([place])    Hypertension    There are no problems to display for this patient.  Home Medication(s) Prior to Admission medications   Not on File                                                                                                                                    Allergies Patient has no known allergies.  Review of Systems Review of Systems  Musculoskeletal:  Positive for neck pain.   As noted in HPI  Physical Exam Vital Signs  I have reviewed the triage vital signs BP (!) 156/108   Pulse 73   Temp 97.7 F (36.5 C)   Resp 18   Ht '6\' 2"'$  (1.88 m)   Wt 93.9 kg   SpO2 99%   BMI 26.58 kg/m  *** Physical Exam  ED Results and Treatments Labs (all labs ordered are listed, but only abnormal results are displayed) Labs Reviewed - No data to display                                                                                                                       EKG  EKG Interpretation  Date/Time:    Ventricular Rate:    PR Interval:    QRS Duration:   QT Interval:    QTC Calculation:   R Axis:     Text Interpretation:         Radiology No results found.  Pertinent labs & imaging results that were available during my care of the patient were reviewed by me and considered in my medical decision making (see MDM for details).  Medications Ordered in ED Medications - No data to display  Procedures Procedures  (including critical care time)  Medical Decision  Making / ED Course    Complexity of Problem:  Co-morbidities/SDOH that complicate the patient evaluation/care: ***  Additional history obtained: ***  Patient's presenting problem/concern, DDX, and MDM listed below: ***  Hospitalization Considered:  ***  Initial Intervention:  ***    Complexity of Data:   Cardiac Monitoring: ***  Laboratory Tests ordered listed below with my independent interpretation: ***   Imaging Studies ordered listed below with my independent interpretation: ***     ED Course:    Assessment, Add'l Intervention, and Reassessment: ***  ***  Final Clinical Impression(s) / ED Diagnoses Final diagnoses:  None    {Document critical care time when appropriate:1}  {Document review of labs and clinical decision tools ie heart score, Chads2Vasc2 etc:1}  {Document your independent review of radiology images, and any outside records:1} {Document your discussion with family members, caretakers, and with consultants:1} {Document social determinants of health affecting pt's care:1} {Document your decision making why or why not admission, treatments were needed:1} This chart was dictated using voice recognition software.  Despite best efforts to proofread,  errors can occur which can change the documentation meaning.

## 2021-12-10 DIAGNOSIS — M5451 Vertebrogenic low back pain: Secondary | ICD-10-CM | POA: Diagnosis not present

## 2021-12-10 DIAGNOSIS — M25552 Pain in left hip: Secondary | ICD-10-CM | POA: Diagnosis not present

## 2021-12-15 ENCOUNTER — Emergency Department (HOSPITAL_COMMUNITY)
Admission: EM | Admit: 2021-12-15 | Discharge: 2021-12-16 | Disposition: A | Discharge status: Home / Self Care | Payer: Medicare Other | Attending: Emergency Medicine | Admitting: Emergency Medicine

## 2021-12-15 ENCOUNTER — Other Ambulatory Visit: Payer: Self-pay

## 2021-12-15 ENCOUNTER — Encounter (HOSPITAL_COMMUNITY): Payer: Self-pay

## 2021-12-15 DIAGNOSIS — M5432 Sciatica, left side: Secondary | ICD-10-CM | POA: Insufficient documentation

## 2021-12-15 DIAGNOSIS — E119 Type 2 diabetes mellitus without complications: Secondary | ICD-10-CM | POA: Diagnosis not present

## 2021-12-15 DIAGNOSIS — I10 Essential (primary) hypertension: Secondary | ICD-10-CM | POA: Insufficient documentation

## 2021-12-15 DIAGNOSIS — M5416 Radiculopathy, lumbar region: Secondary | ICD-10-CM | POA: Diagnosis not present

## 2021-12-15 DIAGNOSIS — M25552 Pain in left hip: Secondary | ICD-10-CM | POA: Diagnosis present

## 2021-12-15 DIAGNOSIS — M5442 Lumbago with sciatica, left side: Secondary | ICD-10-CM | POA: Diagnosis not present

## 2021-12-15 DIAGNOSIS — M5451 Vertebrogenic low back pain: Secondary | ICD-10-CM | POA: Diagnosis not present

## 2021-12-15 LAB — CBC WITH DIFFERENTIAL/PLATELET
Abs Immature Granulocytes: 0.06 10*3/uL (ref 0.00–0.07)
Basophils Absolute: 0 10*3/uL (ref 0.0–0.1)
Basophils Relative: 0 %
Eosinophils Absolute: 0.1 10*3/uL (ref 0.0–0.5)
Eosinophils Relative: 1 %
HCT: 40.8 % (ref 39.0–52.0)
Hemoglobin: 13.5 g/dL (ref 13.0–17.0)
Immature Granulocytes: 1 %
Lymphocytes Relative: 24 %
Lymphs Abs: 2.9 10*3/uL (ref 0.7–4.0)
MCH: 29.1 pg (ref 26.0–34.0)
MCHC: 33.1 g/dL (ref 30.0–36.0)
MCV: 87.9 fL (ref 80.0–100.0)
Monocytes Absolute: 1 10*3/uL (ref 0.1–1.0)
Monocytes Relative: 8 %
Neutro Abs: 8 10*3/uL — ABNORMAL HIGH (ref 1.7–7.7)
Neutrophils Relative %: 66 %
Platelets: 296 10*3/uL (ref 150–400)
RBC: 4.64 MIL/uL (ref 4.22–5.81)
RDW: 14.4 % (ref 11.5–15.5)
WBC: 12 10*3/uL — ABNORMAL HIGH (ref 4.0–10.5)
nRBC: 0 % (ref 0.0–0.2)

## 2021-12-15 NOTE — ED Triage Notes (Signed)
Patient said his left hip down to his left knee has been causing him pain for 2 weeks. Today he got an MRI. He does not have the results yet. Patient unable to walk, sit or move. Bed bound for 6 days. No numbness or tingling in leg.

## 2021-12-15 NOTE — ED Provider Triage Note (Signed)
Emergency Medicine Provider Triage Evaluation Note  Chase Riley , a 81 y.o. male  was evaluated in triage.  Pt complains of left lower back, hip, and leg pain.  Symptoms started 1 to 2 weeks ago.  Was seen in the emergency department on July 4, treated with Toradol, steroid, Robaxin.  Robaxin did seem to provide some relief.  Follow-up with PCP and was referred to orthopedics.  He had MRI today, but was having severe pain and difficulty walking after the exam.  Patient has never had severe pain like this in his life.  Brought to the emergency department for further evaluation.  States follow-up with orthopedics on 20 July.  Review of Systems  Positive: Back pain Negative: Fever  Physical Exam  BP (!) 180/109 (BP Location: Left Arm)   Pulse 95   Temp 97.7 F (36.5 C) (Oral)   Resp 18   Ht '6\' 2"'$  (1.88 m)   Wt 93.9 kg   SpO2 98%   BMI 26.58 kg/m  Gen:   Awake, appears extremely uncomfortable Resp:  Normal effort  MSK:   Moves extremities without difficulty  Other:    Medical Decision Making  Medically screening exam initiated at 7:28 PM.  Appropriate orders placed.  Andi Mahaffy was informed that the remainder of the evaluation will be completed by another provider, this initial triage assessment does not replace that evaluation, and the importance of remaining in the ED until their evaluation is complete.  Unable to see MRI imaging in Epic at this time.    Carlisle Cater, PA-C 12/15/21 1930

## 2021-12-16 DIAGNOSIS — M5432 Sciatica, left side: Secondary | ICD-10-CM | POA: Diagnosis not present

## 2021-12-16 LAB — COMPREHENSIVE METABOLIC PANEL
ALT: 18 U/L (ref 0–44)
AST: 18 U/L (ref 15–41)
Albumin: 4.4 g/dL (ref 3.5–5.0)
Alkaline Phosphatase: 49 U/L (ref 38–126)
Anion gap: 10 (ref 5–15)
BUN: 35 mg/dL — ABNORMAL HIGH (ref 8–23)
CO2: 25 mmol/L (ref 22–32)
Calcium: 9.7 mg/dL (ref 8.9–10.3)
Chloride: 107 mmol/L (ref 98–111)
Creatinine, Ser: 1.71 mg/dL — ABNORMAL HIGH (ref 0.61–1.24)
GFR, Estimated: 40 mL/min — ABNORMAL LOW (ref >60)
Glucose, Bld: 155 mg/dL — ABNORMAL HIGH (ref 70–99)
Potassium: 3.8 mmol/L (ref 3.5–5.1)
Sodium: 142 mmol/L (ref 135–145)
Total Bilirubin: 0.6 mg/dL (ref 0.3–1.2)
Total Protein: 7.5 g/dL (ref 6.5–8.1)

## 2021-12-16 MED ORDER — METHOCARBAMOL 500 MG PO TABS: 500.0000 mg | ORAL_TABLET | Freq: Three times a day (TID) | ORAL | 1 refills | Status: DC | PRN

## 2021-12-16 MED ORDER — LIDOCAINE 5 % EX PTCH: 1.0000 | MEDICATED_PATCH | CUTANEOUS | 0 refills | Status: DC

## 2021-12-16 MED ORDER — POLYETHYLENE GLYCOL 3350 17 G PO PACK: 17.0000 g | PACK | Freq: Every day | ORAL | 1 refills | Status: DC

## 2021-12-16 MED ORDER — HYDROMORPHONE HCL 1 MG/ML IJ SOLN
1.0000 mg | Freq: Once | INTRAMUSCULAR | Status: AC
  Administered 2021-12-16: 1 mg via INTRAMUSCULAR
  Filled 2021-12-16: qty 1

## 2021-12-16 NOTE — ED Provider Notes (Signed)
[facility] Emergency Department Provider Note MRN:  [id number]  Arrival date & time: 12/16/21     Chief Complaint   Hip Pain   History of Present Illness   Chase Riley is a 81 y.o. year-old male with a history of hypertension, diabetes presenting to the ED with chief complaint of hip pain.  Pain in the left lower back, left buttocks, radiation into the left hip, down the front of the right leg.  Has been seen in the emergency department, has followed up with orthopedics, had an MRI earlier today at [facility].  Doctors are telling him that he probably has a pinched nerve in his lower back causing the pain.  Pain continues, and ran out of his home muscle relaxer which was helping.  No numbness or weakness to the arms or legs, no bowel or bladder dysfunction, no fever, no other complaints.  Review of Systems  A thorough review of systems was obtained and all systems are negative except as noted in the HPI and PMH.   Patient's Health History    Past Medical History:  Diagnosis Date   Diabetes mellitus without complication ([place])    Hypertension     History reviewed. No pertinent surgical history.  History reviewed. No pertinent family history.  Social History   Socioeconomic History   Marital status: Married    Spouse name: Not on file   Number of children: Not on file   Years of education: Not on file   Highest education level: Not on file  Occupational History   Not on file  Tobacco Use   Smoking status: Never   Smokeless tobacco: Never  Substance and Sexual Activity   Alcohol use: Never   Drug use: Never   Sexual activity: Not on file  Other Topics Concern   Not on file  Social History Narrative   Not on file   Social Determinants of Health   Financial Resource Strain: Not on file  Food Insecurity: Not on file  Transportation Needs: Not on file  Physical Activity: Not on file  Stress: Not on file  Social Connections: Not on file   Intimate Partner Violence: Not on file     Physical Exam   Vitals:   12/15/21 2355 12/16/21 0100  BP: (!) 146/92 (!) 111/92  Pulse: (!) 102 68  Resp: 19 19  Temp:    SpO2: 93% 98%    CONSTITUTIONAL: Well-appearing, NAD NEURO/PSYCH:  Alert and oriented x 3, normal and symmetric strength and sensation, normal coordination, normal speech EYES:  eyes equal and reactive ENT/NECK:  no LAD, no JVD CARDIO: Regular rate, well-perfused, normal S1 and S2 PULM:  CTAB no wheezing or rhonchi GI/GU:  non-distended, non-tender MSK/SPINE:  No gross deformities, no edema SKIN:  no rash, atraumatic   *Additional and/or pertinent findings included in MDM below  Diagnostic and Interventional Summary    EKG Interpretation  Date/Time:    Ventricular Rate:    PR Interval:    QRS Duration:   QT Interval:    QTC Calculation:   R Axis:     Text Interpretation:         Labs Reviewed  CBC WITH DIFFERENTIAL/PLATELET - Abnormal; Notable for the following components:      Result Value   WBC 12.0 (*)    Neutro Abs 8.0 (*)    All other components within normal limits  COMPREHENSIVE METABOLIC PANEL - Abnormal; Notable for the following components:   Glucose, Bld  155 (*)    BUN 35 (*)    Creatinine, Ser 1.71 (*)    GFR, Estimated 40 (*)    All other components within normal limits    No orders to display    Medications  HYDROmorphone (DILAUDID) injection 1 mg (1 mg Intramuscular Given 12/16/21 0059)     Procedures  /  Critical Care Procedures  ED Course and Medical Decision Making  Initial Impression and Ddx Continued pain to the left back, hip, leg, favoring radicular pain, sciatica.  The hip has preserved range of motion, highly doubt septic joint.  No recent trauma.  Had MRI earlier today, no indication for further imaging at this time.  No signs or symptoms of myelopathy.  This seems like a visit for improved pain control, providing dose of Dilaudid and will reassess  Past  medical/surgical history that increases complexity of ED encounter: None  Interpretation of Diagnostics I personally reviewed the laboratory assessment and my interpretation is as follows: No significant blood count or electrolyte disturbance    Patient Reassessment and Ultimate Disposition/Management     Patient feeling a lot better after dose of Dilaudid, appropriate for discharge on pain regimen.  Patient management required discussion with the following services or consulting groups:  None  Complexity of Problems Addressed Acute illness or injury that poses threat of life of bodily function  Additional Data Reviewed and Analyzed Further history obtained from: Further history from spouse/family member  Additional Factors Impacting ED Encounter Risk Prescriptions  Barth Kirks. [place] Small, [place] [email or web address]  Final Clinical Impressions(s) / ED Diagnoses     ICD-10-CM   1. Sciatica of left side  M54.32       ED Discharge Orders          Ordered    methocarbamol (ROBAXIN) 500 MG tablet  Every 8 hours PRN        12/16/21 0149    lidocaine (LIDODERM) 5 %  Every 24 hours        12/16/21 0149    polyethylene glycol (MIRALAX) 17 g packet  Daily        12/16/21 0149             Discharge Instructions Discussed with and Provided to Patient:     Discharge Instructions      You were evaluated in the Emergency Department and after careful evaluation, we did not find any emergent condition requiring admission or further testing in the hospital.  Your exam/testing today is overall reassuring.  Recommend continued follow-up with the orthopedics team for management.  Can use the numbing patches, Robaxin as needed.  Can use the MiraLAX as we discussed.  Please return to the Emergency Department if you experience any worsening of your condition.   Thank you for allowing [place] to be a part of your care.       Maudie Flakes, MD 12/16/21 [phone]

## 2021-12-16 NOTE — Discharge Instructions (Signed)
You were evaluated in the Emergency Department and after careful evaluation, we did not find any emergent condition requiring admission or further testing in the hospital.  Your exam/testing today is overall reassuring.  Recommend continued follow-up with the orthopedics team for management.  Can use the numbing patches, Robaxin as needed.  Can use the MiraLAX as we discussed.  Please return to the Emergency Department if you experience any worsening of your condition.   Thank you for allowing [place] to be a part of your care.

## 2021-12-21 DIAGNOSIS — M5451 Vertebrogenic low back pain: Secondary | ICD-10-CM | POA: Diagnosis not present

## 2021-12-24 DIAGNOSIS — M5410 Radiculopathy, site unspecified: Secondary | ICD-10-CM | POA: Diagnosis not present

## 2021-12-24 DIAGNOSIS — N1831 Chronic kidney disease, stage 3a: Secondary | ICD-10-CM | POA: Diagnosis not present

## 2021-12-24 DIAGNOSIS — Z01818 Encounter for other preprocedural examination: Secondary | ICD-10-CM | POA: Diagnosis not present

## 2021-12-24 DIAGNOSIS — M5116 Intervertebral disc disorders with radiculopathy, lumbar region: Secondary | ICD-10-CM | POA: Diagnosis not present

## 2021-12-29 NOTE — Pre-Procedure Instructions (Signed)
Surgical Instructions    Your procedure is scheduled on Monday, July 31st.  Report to [facility] Main Entrance "A" at 2:15 P.M., then check in with the Admitting office.  Call this number if you have problems the morning of surgery:  [phone]   If you have any questions prior to your surgery date call [phone]: Open Monday-Friday 8am-4pm    Remember:  Do not eat or drink after midnight the night before your surgery      Take these medicines the morning of surgery with A SIP OF WATER  methocarbamol (ROBAXIN)- if needed  As of today, STOP taking any Aspirin (unless otherwise instructed by your surgeon) Aleve, Naproxen, Ibuprofen, Motrin, Advil, Goody's, BC's, all herbal medications, fish oil, and all vitamins.                     Do NOT Smoke (Tobacco/Vaping) for 24 hours prior to your procedure.  If you use a CPAP at night, you may bring your mask/headgear for your overnight stay.   Contacts, glasses, piercing's, hearing aid's, dentures or partials may not be worn into surgery, please bring cases for these belongings.    For patients admitted to the hospital, discharge time will be determined by your treatment team.   Patients discharged the day of surgery will not be allowed to drive home, and someone needs to stay with them for 24 hours.  SURGICAL WAITING ROOM VISITATION Patients having surgery or a procedure may have no more than 2 support people in the waiting area - these visitors may rotate.   Children under the age of 76 must have an adult with them who is not the patient. If the patient needs to stay at the hospital during part of their recovery, the visitor guidelines for inpatient rooms apply. Pre-op nurse will coordinate an appropriate time for 1 support person to accompany patient in pre-op.  This support person may not rotate.   Please refer to the [facility] website for the visitor guidelines for Inpatients (after your surgery is over and you are in a  regular room).    Special instructions:   [place]- Preparing For Surgery  Before surgery, you can play an important role. Because skin is not sterile, your skin needs to be as free of germs as possible. You can reduce the number of germs on your skin by washing with CHG (chlorahexidine gluconate) Soap before surgery.  CHG is an antiseptic cleaner which kills germs and bonds with the skin to continue killing germs even after washing.    Oral Hygiene is also important to reduce your risk of infection.  Remember - BRUSH YOUR TEETH THE MORNING OF SURGERY WITH YOUR REGULAR TOOTHPASTE  Please do not use if you have an allergy to CHG or antibacterial soaps. If your skin becomes reddened/irritated stop using the CHG.  Do not shave (including legs and underarms) for at least 48 hours prior to first CHG shower. It is OK to shave your face.  Please follow these instructions carefully.   Shower the NIGHT BEFORE SURGERY and the MORNING OF SURGERY  If you chose to wash your hair, wash your hair first as usual with your normal shampoo.  After you shampoo, rinse your hair and body thoroughly to remove the shampoo.  Use CHG Soap as you would any other liquid soap. You can apply CHG directly to the skin and wash gently with a scrungie or a clean washcloth.   Apply the CHG Soap  to your body ONLY FROM THE NECK DOWN.  Do not use on open wounds or open sores. Avoid contact with your eyes, ears, mouth and genitals (private parts). Wash Face and genitals (private parts)  with your normal soap.   Wash thoroughly, paying special attention to the area where your surgery will be performed.  Thoroughly rinse your body with warm water from the neck down.  DO NOT shower/wash with your normal soap after using and rinsing off the CHG Soap.  Pat yourself dry with a CLEAN TOWEL.  Wear CLEAN PAJAMAS to bed the night before surgery  Place CLEAN SHEETS on your bed the night before your surgery  DO NOT SLEEP WITH  PETS.   Day of Surgery: Take a shower with CHG soap. Do not wear jewelry  Do not wear lotions, powders, colognes, or deodorant. Men may shave face and neck. Do not bring valuables to the hospital. [facility] is not responsible for any belongings or valuables.  Wear Clean/Comfortable clothing the morning of surgery Remember to brush your teeth WITH YOUR REGULAR TOOTHPASTE.   Please read over the following fact sheets that you were given.    If you received a COVID test during your pre-op visit  it is requested that you wear a mask when out in public, stay away from anyone that may not be feeling well and notify your surgeon if you develop symptoms. If you have been in contact with anyone that has tested positive in the last 10 days please notify you surgeon.

## 2021-12-30 ENCOUNTER — Ambulatory Visit: Payer: Self-pay | Admitting: Orthopedic Surgery

## 2021-12-30 ENCOUNTER — Encounter (HOSPITAL_COMMUNITY)
Admission: RE | Admit: 2021-12-30 | Discharge: 2021-12-30 | Disposition: A | Discharge status: Home / Self Care | Payer: Medicare Other | Source: Ambulatory Visit | Attending: Orthopedic Surgery | Admitting: Orthopedic Surgery

## 2021-12-30 ENCOUNTER — Other Ambulatory Visit: Payer: Self-pay

## 2021-12-30 ENCOUNTER — Encounter (HOSPITAL_COMMUNITY): Payer: Self-pay

## 2021-12-30 VITALS — BP 130/73 | HR 120 | Temp 97.6°F | Resp 18 | Ht 74.0 in | Wt 198.5 lb

## 2021-12-30 DIAGNOSIS — I10 Essential (primary) hypertension: Secondary | ICD-10-CM | POA: Insufficient documentation

## 2021-12-30 DIAGNOSIS — E119 Type 2 diabetes mellitus without complications: Secondary | ICD-10-CM | POA: Diagnosis not present

## 2021-12-30 DIAGNOSIS — Z01818 Encounter for other preprocedural examination: Secondary | ICD-10-CM | POA: Insufficient documentation

## 2021-12-30 HISTORY — DX: Anemia, unspecified: D64.9

## 2021-12-30 HISTORY — DX: Chronic kidney disease, unspecified: N18.9

## 2021-12-30 LAB — BASIC METABOLIC PANEL
Anion gap: 12 (ref 5–15)
BUN: 26 mg/dL — ABNORMAL HIGH (ref 8–23)
CO2: 28 mmol/L (ref 22–32)
Calcium: 10.6 mg/dL — ABNORMAL HIGH (ref 8.9–10.3)
Chloride: 101 mmol/L (ref 98–111)
Creatinine, Ser: 2.02 mg/dL — ABNORMAL HIGH (ref 0.61–1.24)
GFR, Estimated: 33 mL/min — ABNORMAL LOW (ref >60)
Glucose, Bld: 140 mg/dL — ABNORMAL HIGH (ref 70–99)
Potassium: 3.4 mmol/L — ABNORMAL LOW (ref 3.5–5.1)
Sodium: 141 mmol/L (ref 135–145)

## 2021-12-30 LAB — SURGICAL PCR SCREEN
MRSA, PCR: NEGATIVE
Staphylococcus aureus: NEGATIVE

## 2021-12-30 LAB — GLUCOSE, CAPILLARY: Glucose-Capillary: 187 mg/dL — ABNORMAL HIGH (ref 70–99)

## 2021-12-30 NOTE — Progress Notes (Signed)
PCP - Marda Stalker, PA Cardiologist - denies  PPM/ICD - denies   Chest x-ray - 09/20/21 EKG - 12/30/21 Stress Test - denies ECHO - denies Cardiac Cath - denies  Sleep Study - denies   DM- Type 2 Pt does not check CBG at home  ASA/Blood Thinner Instructions: n/a   ERAS Protcol - no, NPO   COVID TEST- n/a   Anesthesia review: no  Patient denies shortness of breath, fever, cough and chest pain at PAT appointment   All instructions explained to the patient, with a verbal understanding of the material. Patient agrees to go over the instructions while at home for a better understanding.  The opportunity to ask questions was provided.

## 2021-12-31 NOTE — Progress Notes (Signed)
Anesthesia Chart Review:  History of CKD 3a/b followed by PCP Marda Stalker, PA-C at [facility]  Review of labs in [place] shows baseline creatinine ~1.6.  Creatinine mildly elevated above baseline on preop labs at 2.02, GFR consistent with CKD 3b.  CBC in Care Everywhere from 12/24/2021 reviewed, mild anemia with hemoglobin 12.3, otherwise unremarkable.  PCP cleared patient for surgery on 12/24/2021, copy of clearance on chart.  Non-insulin-dependent DM2, last A1c 7.2 on 12/24/2021.  Creatinine, Ser (mg/dL)  Date Value  12/30/2021 2.02 (H)  12/15/2021 1.71 (H)  12/07/2021 1.64 (H)    EKG 12/30/2021: Sinus tach.  Rate 101.  Possible LAE.  LAD.  LVH with repolarization abnormality.    Wynonia Musty [facility] Short Stay Center/Anesthesiology Phone [phone] 12/31/2021 9:28 AM

## 2021-12-31 NOTE — Anesthesia Preprocedure Evaluation (Addendum)
Anesthesia Evaluation  Patient identified by MRN, date of birth, ID band Patient awake    Reviewed: Allergy & Precautions, NPO status , Patient's Chart, lab work & pertinent test results  Airway Mallampati: I  TM Distance: >3 FB Neck ROM: Full    Dental  (+) Dental Advisory Given, Chipped, Teeth Intact,    Pulmonary neg pulmonary ROS,    Pulmonary exam normal breath sounds clear to auscultation       Cardiovascular hypertension, Pt. on medications Normal cardiovascular exam Rhythm:Regular Rate:Normal     Neuro/Psych negative neurological ROS  negative psych ROS   GI/Hepatic negative GI ROS, Neg liver ROS,   Endo/Other  diabetes, Type 2, Oral Hypoglycemic Agents  Renal/GU Renal InsufficiencyRenal diseaseLab Results      Component                Value               Date                      CREATININE               2.02 (H)            12/30/2021                BUN                      26 (H)              12/30/2021                NA                       141                 12/30/2021                K                        3.4 (L)             12/30/2021                CL                       101                 12/30/2021                CO2                      28                  12/30/2021             negative genitourinary   Musculoskeletal negative musculoskeletal ROS (+)   Abdominal   Peds  Hematology negative hematology ROS (+)   Anesthesia Other Findings   Reproductive/Obstetrics                           Anesthesia Physical Anesthesia Plan  ASA: 3  Anesthesia Plan: General   Post-op Pain Management: Tylenol PO (pre-op)*   Induction: Intravenous  PONV Risk Score and Plan: 2 and Dexamethasone, Ondansetron and Treatment may vary due to age or medical condition  Airway Management Planned: Oral ETT  Additional Equipment:   Intra-op Plan:   Post-operative Plan: Extubation  in OR  Informed Consent: I have reviewed the patients History and Physical, chart, labs and discussed the procedure including the risks, benefits and alternatives for the proposed anesthesia with the patient or authorized representative who has indicated his/her understanding and acceptance.     Dental advisory given  Plan Discussed with: CRNA  Anesthesia Plan Comments: (  )       Anesthesia Quick Evaluation

## 2022-01-03 ENCOUNTER — Other Ambulatory Visit: Payer: Self-pay

## 2022-01-03 ENCOUNTER — Ambulatory Visit (HOSPITAL_COMMUNITY): Payer: Medicare Other

## 2022-01-03 ENCOUNTER — Ambulatory Visit (HOSPITAL_COMMUNITY): Payer: Medicare Other | Admitting: Physician Assistant

## 2022-01-03 ENCOUNTER — Encounter (HOSPITAL_COMMUNITY)
Admission: RE | Disposition: A | Discharge status: Home / Self Care | Payer: Self-pay | Source: Home / Self Care | Attending: Orthopedic Surgery

## 2022-01-03 ENCOUNTER — Ambulatory Visit (HOSPITAL_BASED_OUTPATIENT_CLINIC_OR_DEPARTMENT_OTHER): Payer: Medicare Other

## 2022-01-03 ENCOUNTER — Observation Stay (HOSPITAL_COMMUNITY)
Admission: RE | Admit: 2022-01-03 | Discharge: 2022-01-04 | Disposition: A | Discharge status: Home / Self Care | Payer: Medicare Other | Attending: Orthopedic Surgery | Admitting: Orthopedic Surgery

## 2022-01-03 ENCOUNTER — Encounter (HOSPITAL_COMMUNITY): Payer: Self-pay | Admitting: Orthopedic Surgery

## 2022-01-03 DIAGNOSIS — I129 Hypertensive chronic kidney disease with stage 1 through stage 4 chronic kidney disease, or unspecified chronic kidney disease: Secondary | ICD-10-CM | POA: Insufficient documentation

## 2022-01-03 DIAGNOSIS — Z0389 Encounter for observation for other suspected diseases and conditions ruled out: Secondary | ICD-10-CM | POA: Diagnosis not present

## 2022-01-03 DIAGNOSIS — M5126 Other intervertebral disc displacement, lumbar region: Secondary | ICD-10-CM | POA: Diagnosis not present

## 2022-01-03 DIAGNOSIS — Z79899 Other long term (current) drug therapy: Secondary | ICD-10-CM | POA: Insufficient documentation

## 2022-01-03 DIAGNOSIS — M5116 Intervertebral disc disorders with radiculopathy, lumbar region: Principal | ICD-10-CM | POA: Insufficient documentation

## 2022-01-03 DIAGNOSIS — I10 Essential (primary) hypertension: Secondary | ICD-10-CM | POA: Diagnosis not present

## 2022-01-03 DIAGNOSIS — E1122 Type 2 diabetes mellitus with diabetic chronic kidney disease: Secondary | ICD-10-CM | POA: Diagnosis not present

## 2022-01-03 DIAGNOSIS — Z7984 Long term (current) use of oral hypoglycemic drugs: Secondary | ICD-10-CM

## 2022-01-03 DIAGNOSIS — N184 Chronic kidney disease, stage 4 (severe): Secondary | ICD-10-CM | POA: Diagnosis not present

## 2022-01-03 DIAGNOSIS — M5136 Other intervertebral disc degeneration, lumbar region: Secondary | ICD-10-CM | POA: Diagnosis not present

## 2022-01-03 DIAGNOSIS — E119 Type 2 diabetes mellitus without complications: Secondary | ICD-10-CM

## 2022-01-03 DIAGNOSIS — R2689 Other abnormalities of gait and mobility: Secondary | ICD-10-CM | POA: Insufficient documentation

## 2022-01-03 HISTORY — PX: LUMBAR LAMINECTOMY/DECOMPRESSION MICRODISCECTOMY: SHX5026

## 2022-01-03 LAB — GLUCOSE, CAPILLARY
Glucose-Capillary: 118 mg/dL — ABNORMAL HIGH (ref 70–99)
Glucose-Capillary: 142 mg/dL — ABNORMAL HIGH (ref 70–99)
Glucose-Capillary: 128 mg/dL — ABNORMAL HIGH (ref 70–99)
Glucose-Capillary: 182 mg/dL — ABNORMAL HIGH (ref 70–99)

## 2022-01-03 LAB — HEMOGLOBIN A1C
Hgb A1c MFr Bld: 7.1 % — ABNORMAL HIGH (ref 4.8–5.6)
Mean Plasma Glucose: 157.07 mg/dL

## 2022-01-03 SURGERY — LUMBAR LAMINECTOMY/DECOMPRESSION MICRODISCECTOMY 1 LEVEL
Anesthesia: General | Site: Spine Lumbar | Laterality: Left

## 2022-01-03 MED ORDER — DEXAMETHASONE SODIUM PHOSPHATE 10 MG/ML IJ SOLN
INTRAMUSCULAR | Status: AC
Start: 2022-01-03 — End: ?
  Filled 2022-01-03: qty 1

## 2022-01-03 MED ORDER — CEFAZOLIN SODIUM-DEXTROSE 2-4 GM/100ML-% IV SOLN
2.0000 g | INTRAVENOUS | Status: AC
  Administered 2022-01-03: 2 g via INTRAVENOUS
  Filled 2022-01-03: qty 100

## 2022-01-03 MED ORDER — ACETAMINOPHEN 650 MG RE SUPP: 650.0000 mg | RECTAL | Status: DC | PRN

## 2022-01-03 MED ORDER — METHOCARBAMOL 500 MG PO TABS
500.0000 mg | ORAL_TABLET | Freq: Four times a day (QID) | ORAL | Status: DC | PRN
  Administered 2022-01-03 – 2022-01-04 (×2): 500 mg via ORAL
  Filled 2022-01-03 (×2): qty 1

## 2022-01-03 MED ORDER — IRBESARTAN 150 MG PO TABS: 300.0000 mg | ORAL_TABLET | Freq: Every day | ORAL | Status: DC

## 2022-01-03 MED ORDER — PROPOFOL 10 MG/ML IV BOLUS
INTRAVENOUS | Status: AC
  Filled 2022-01-03: qty 20

## 2022-01-03 MED ORDER — DEXAMETHASONE SODIUM PHOSPHATE 10 MG/ML IJ SOLN
INTRAMUSCULAR | Status: DC | PRN
  Administered 2022-01-03: 5 mg via INTRAVENOUS

## 2022-01-03 MED ORDER — ROCURONIUM BROMIDE 10 MG/ML (PF) SYRINGE
PREFILLED_SYRINGE | INTRAVENOUS | Status: DC | PRN
  Administered 2022-01-03: 70 mg via INTRAVENOUS

## 2022-01-03 MED ORDER — INSULIN ASPART 100 UNIT/ML IJ SOLN: 0.0000 [IU] | INTRAMUSCULAR | Status: DC | PRN

## 2022-01-03 MED ORDER — INSULIN ASPART 100 UNIT/ML IJ SOLN
0.0000 [IU] | Freq: Three times a day (TID) | INTRAMUSCULAR | Status: DC
  Administered 2022-01-04: 3 [IU] via SUBCUTANEOUS

## 2022-01-03 MED ORDER — HYDROMORPHONE HCL 1 MG/ML IJ SOLN: 1.0000 mg | INTRAMUSCULAR | Status: DC | PRN

## 2022-01-03 MED ORDER — POLYETHYLENE GLYCOL 3350 17 G PO PACK: 17.0000 g | PACK | Freq: Every day | ORAL | Status: DC | PRN

## 2022-01-03 MED ORDER — SURGIFLO WITH THROMBIN (HEMOSTATIC MATRIX KIT) OPTIME
TOPICAL | Status: DC | PRN
  Administered 2022-01-03: 1 via TOPICAL

## 2022-01-03 MED ORDER — METHYLPREDNISOLONE ACETATE 40 MG/ML INJ SUSP (RADIOLOG
INTRAMUSCULAR | Status: DC | PRN
  Administered 2022-01-03: 40 mg

## 2022-01-03 MED ORDER — THROMBIN 20000 UNITS EX SOLR
CUTANEOUS | Status: AC
  Filled 2022-01-03: qty 20000

## 2022-01-03 MED ORDER — PROPOFOL 10 MG/ML IV BOLUS
INTRAVENOUS | Status: DC | PRN
  Administered 2022-01-03: 170 mg via INTRAVENOUS

## 2022-01-03 MED ORDER — SODIUM CHLORIDE 0.9% FLUSH
3.0000 mL | Freq: Two times a day (BID) | INTRAVENOUS | Status: DC
Start: 2022-01-03 — End: 2022-01-04
  Administered 2022-01-03: 3 mL via INTRAVENOUS

## 2022-01-03 MED ORDER — OXYCODONE-ACETAMINOPHEN 10-325 MG PO TABS: 1.0000 | ORAL_TABLET | Freq: Four times a day (QID) | ORAL | 0 refills | Status: AC | PRN

## 2022-01-03 MED ORDER — BUPIVACAINE-EPINEPHRINE 0.25% -1:200000 IJ SOLN
INTRAMUSCULAR | Status: DC | PRN
  Administered 2022-01-03: 10 mL

## 2022-01-03 MED ORDER — CEFAZOLIN SODIUM-DEXTROSE 1-4 GM/50ML-% IV SOLN
1.0000 g | Freq: Three times a day (TID) | INTRAVENOUS | Status: AC
  Administered 2022-01-03 – 2022-01-04 (×2): 1 g via INTRAVENOUS
  Filled 2022-01-03 (×2): qty 50

## 2022-01-03 MED ORDER — METHOCARBAMOL 1000 MG/10ML IJ SOLN
500.0000 mg | Freq: Four times a day (QID) | INTRAVENOUS | Status: DC | PRN
  Filled 2022-01-03: qty 5

## 2022-01-03 MED ORDER — PHENOL 1.4 % MT LIQD: 1.0000 | OROMUCOSAL | Status: DC | PRN

## 2022-01-03 MED ORDER — SUGAMMADEX SODIUM 200 MG/2ML IV SOLN
INTRAVENOUS | Status: DC | PRN
  Administered 2022-01-03: 200 mg via INTRAVENOUS

## 2022-01-03 MED ORDER — ONDANSETRON HCL 4 MG/2ML IJ SOLN
INTRAMUSCULAR | Status: DC | PRN
  Administered 2022-01-03: 4 mg via INTRAVENOUS

## 2022-01-03 MED ORDER — FENTANYL CITRATE (PF) 250 MCG/5ML IJ SOLN
INTRAMUSCULAR | Status: AC
  Filled 2022-01-03: qty 5

## 2022-01-03 MED ORDER — ACETAMINOPHEN 325 MG PO TABS
650.0000 mg | ORAL_TABLET | ORAL | Status: DC | PRN
  Administered 2022-01-04 (×2): 650 mg via ORAL
  Filled 2022-01-03 (×2): qty 2

## 2022-01-03 MED ORDER — BUPIVACAINE-EPINEPHRINE (PF) 0.25% -1:200000 IJ SOLN
INTRAMUSCULAR | Status: AC
  Filled 2022-01-03: qty 30

## 2022-01-03 MED ORDER — FLEET ENEMA 7-19 GM/118ML RE ENEM: 1.0000 | ENEMA | Freq: Once | RECTAL | Status: DC | PRN

## 2022-01-03 MED ORDER — FENTANYL CITRATE (PF) 250 MCG/5ML IJ SOLN
INTRAMUSCULAR | Status: DC | PRN
  Administered 2022-01-03: 50 ug via INTRAVENOUS
  Administered 2022-01-03: 100 ug via INTRAVENOUS

## 2022-01-03 MED ORDER — LIDOCAINE 2% (20 MG/ML) 5 ML SYRINGE
INTRAMUSCULAR | Status: DC | PRN
  Administered 2022-01-03: 60 mg via INTRAVENOUS

## 2022-01-03 MED ORDER — OLMESARTAN MEDOXOMIL-HCTZ 40-25 MG PO TABS
1.0000 | ORAL_TABLET | Freq: Every day | ORAL | Status: DC
Start: 2022-01-03 — End: 2022-01-03

## 2022-01-03 MED ORDER — CHLORHEXIDINE GLUCONATE 0.12 % MT SOLN
15.0000 mL | Freq: Once | OROMUCOSAL | Status: AC
  Administered 2022-01-03: 15 mL via OROMUCOSAL
  Filled 2022-01-03: qty 15

## 2022-01-03 MED ORDER — OXYCODONE HCL 5 MG PO TABS: 5.0000 mg | ORAL_TABLET | ORAL | Status: DC | PRN

## 2022-01-03 MED ORDER — SODIUM CHLORIDE 0.9 % IV SOLN
INTRAVENOUS | Status: DC
Start: 2022-01-03 — End: 2022-01-04

## 2022-01-03 MED ORDER — METHYLPREDNISOLONE ACETATE 40 MG/ML IJ SUSP
INTRAMUSCULAR | Status: AC
  Filled 2022-01-03: qty 1

## 2022-01-03 MED ORDER — ONDANSETRON HCL 4 MG PO TABS: 4.0000 mg | ORAL_TABLET | Freq: Three times a day (TID) | ORAL | 0 refills | Status: DC | PRN

## 2022-01-03 MED ORDER — MENTHOL 3 MG MT LOZG
1.0000 | LOZENGE | OROMUCOSAL | Status: DC | PRN
Start: 2022-01-03 — End: 2022-01-04

## 2022-01-03 MED ORDER — ONDANSETRON HCL 4 MG/2ML IJ SOLN
INTRAMUSCULAR | Status: AC
  Filled 2022-01-03: qty 2

## 2022-01-03 MED ORDER — THROMBIN 20000 UNITS EX SOLR: CUTANEOUS | Status: DC | PRN

## 2022-01-03 MED ORDER — ONDANSETRON HCL 4 MG PO TABS: 4.0000 mg | ORAL_TABLET | Freq: Four times a day (QID) | ORAL | Status: DC | PRN

## 2022-01-03 MED ORDER — OXYCODONE HCL 5 MG PO TABS
10.0000 mg | ORAL_TABLET | ORAL | Status: DC | PRN
  Administered 2022-01-03 – 2022-01-04 (×3): 10 mg via ORAL
  Filled 2022-01-03 (×3): qty 2

## 2022-01-03 MED ORDER — ONDANSETRON HCL 4 MG/2ML IJ SOLN: 4.0000 mg | Freq: Four times a day (QID) | INTRAMUSCULAR | Status: DC | PRN

## 2022-01-03 MED ORDER — ALLOPURINOL 100 MG PO TABS
100.0000 mg | ORAL_TABLET | Freq: Two times a day (BID) | ORAL | Status: DC
  Administered 2022-01-03 – 2022-01-04 (×2): 100 mg via ORAL
  Filled 2022-01-03 (×2): qty 1

## 2022-01-03 MED ORDER — PHENYLEPHRINE 80 MCG/ML (10ML) SYRINGE FOR IV PUSH (FOR BLOOD PRESSURE SUPPORT)
PREFILLED_SYRINGE | INTRAVENOUS | Status: DC | PRN
  Administered 2022-01-03 (×2): 240 ug via INTRAVENOUS
  Administered 2022-01-03 (×2): 160 ug via INTRAVENOUS
  Administered 2022-01-03 (×4): 80 ug via INTRAVENOUS

## 2022-01-03 MED ORDER — 0.9 % SODIUM CHLORIDE (POUR BTL) OPTIME
TOPICAL | Status: DC | PRN
  Administered 2022-01-03: 1000 mL

## 2022-01-03 MED ORDER — ROSUVASTATIN CALCIUM 20 MG PO TABS
40.0000 mg | ORAL_TABLET | Freq: Every day | ORAL | Status: DC
  Administered 2022-01-04: 40 mg via ORAL
  Filled 2022-01-03: qty 2

## 2022-01-03 MED ORDER — HYDROCHLOROTHIAZIDE 25 MG PO TABS: 25.0000 mg | ORAL_TABLET | Freq: Every day | ORAL | Status: DC

## 2022-01-03 MED ORDER — ROCURONIUM BROMIDE 10 MG/ML (PF) SYRINGE
PREFILLED_SYRINGE | INTRAVENOUS | Status: AC
  Filled 2022-01-03: qty 10

## 2022-01-03 MED ORDER — ALBUTEROL SULFATE (2.5 MG/3ML) 0.083% IN NEBU: 2.5000 mg | INHALATION_SOLUTION | Freq: Four times a day (QID) | RESPIRATORY_TRACT | Status: DC | PRN

## 2022-01-03 MED ORDER — ALBUTEROL SULFATE HFA 108 (90 BASE) MCG/ACT IN AERS: 1.0000 | INHALATION_SPRAY | Freq: Four times a day (QID) | RESPIRATORY_TRACT | Status: DC | PRN

## 2022-01-03 MED ORDER — LACTATED RINGERS IV SOLN: INTRAVENOUS | Status: DC

## 2022-01-03 MED ORDER — SODIUM CHLORIDE 0.9% FLUSH: 3.0000 mL | INTRAVENOUS | Status: DC | PRN

## 2022-01-03 MED ORDER — METHOCARBAMOL 500 MG PO TABS: 500.0000 mg | ORAL_TABLET | Freq: Three times a day (TID) | ORAL | 0 refills | Status: AC | PRN

## 2022-01-03 MED ORDER — SEMAGLUTIDE 3 MG PO TABS: 3.0000 mg | ORAL_TABLET | Freq: Every day | ORAL | Status: DC

## 2022-01-03 MED ORDER — ORAL CARE MOUTH RINSE: 15.0000 mL | Freq: Once | OROMUCOSAL | Status: AC

## 2022-01-03 MED ORDER — SODIUM CHLORIDE 0.9 % IV SOLN: INTRAVENOUS | Status: DC

## 2022-01-03 MED ORDER — ROCURONIUM BROMIDE 10 MG/ML (PF) SYRINGE
PREFILLED_SYRINGE | INTRAVENOUS | Status: AC
Start: 2022-01-03 — End: ?
  Filled 2022-01-03: qty 10

## 2022-01-03 MED ORDER — FENTANYL CITRATE (PF) 100 MCG/2ML IJ SOLN: 25.0000 ug | INTRAMUSCULAR | Status: DC | PRN

## 2022-01-03 MED ORDER — PHENYLEPHRINE HCL-NACL 20-0.9 MG/250ML-% IV SOLN
INTRAVENOUS | Status: DC | PRN
  Administered 2022-01-03: 30 ug/min via INTRAVENOUS

## 2022-01-03 MED ORDER — ACETAMINOPHEN 500 MG PO TABS
1000.0000 mg | ORAL_TABLET | Freq: Once | ORAL | Status: AC
Start: 2022-01-03 — End: 2022-01-03
  Administered 2022-01-03: 1000 mg via ORAL
  Filled 2022-01-03: qty 2

## 2022-01-03 MED ORDER — LIDOCAINE 2% (20 MG/ML) 5 ML SYRINGE
INTRAMUSCULAR | Status: AC
  Filled 2022-01-03: qty 5

## 2022-01-03 MED ORDER — PHENYLEPHRINE 80 MCG/ML (10ML) SYRINGE FOR IV PUSH (FOR BLOOD PRESSURE SUPPORT)
PREFILLED_SYRINGE | INTRAVENOUS | Status: AC
  Filled 2022-01-03: qty 10

## 2022-01-03 MED ORDER — INSULIN ASPART 100 UNIT/ML IJ SOLN: 0.0000 [IU] | Freq: Every day | INTRAMUSCULAR | Status: DC

## 2022-01-03 MED ORDER — ALBUMIN HUMAN 5 % IV SOLN: INTRAVENOUS | Status: DC | PRN

## 2022-01-03 SURGICAL SUPPLY — 60 items
BAG COUNTER SPONGE SURGICOUNT (BAG) ×2 IMPLANT
BNDG GAUZE ELAST 4 BULKY (GAUZE/BANDAGES/DRESSINGS) ×1 IMPLANT
CANISTER SUCT 3000ML PPV (MISCELLANEOUS) ×2 IMPLANT
CLSR STERI-STRIP ANTIMIC 1/2X4 (GAUZE/BANDAGES/DRESSINGS) ×2 IMPLANT
CORD BIPOLAR FORCEPS 12FT (ELECTRODE) ×2 IMPLANT
COVER SURGICAL LIGHT HANDLE (MISCELLANEOUS) ×2 IMPLANT
DRAIN CHANNEL 15F RND FF W/TCR (WOUND CARE) IMPLANT
DRAPE SURG 17X23 STRL (DRAPES) ×2 IMPLANT
DRAPE U-SHAPE 47X51 STRL (DRAPES) ×2 IMPLANT
DRSG OPSITE POSTOP 3X4 (GAUZE/BANDAGES/DRESSINGS) ×1 IMPLANT
DRSG OPSITE POSTOP 4X6 (GAUZE/BANDAGES/DRESSINGS) ×1 IMPLANT
DURAPREP 26ML APPLICATOR (WOUND CARE) ×2 IMPLANT
ELECT BLADE 4.0 EZ CLEAN MEGAD (MISCELLANEOUS)
ELECT CAUTERY BLADE 6.4 (BLADE) ×2 IMPLANT
ELECT PENCIL ROCKER SW 15FT (MISCELLANEOUS) ×2 IMPLANT
ELECT REM PT RETURN 9FT ADLT (ELECTROSURGICAL) ×2
ELECTRODE BLDE 4.0 EZ CLN MEGD (MISCELLANEOUS) IMPLANT
ELECTRODE REM PT RTRN 9FT ADLT (ELECTROSURGICAL) ×1 IMPLANT
EVACUATOR SILICONE 100CC (DRAIN) IMPLANT
GLOVE BIO SURGEON STRL SZ 6.5 (GLOVE) ×2 IMPLANT
GLOVE BIOGEL PI IND STRL 6.5 (GLOVE) ×1 IMPLANT
GLOVE BIOGEL PI IND STRL 8.5 (GLOVE) ×1 IMPLANT
GLOVE BIOGEL PI INDICATOR 6.5 (GLOVE) ×1
GLOVE BIOGEL PI INDICATOR 8.5 (GLOVE) ×1
GLOVE SS BIOGEL STRL SZ 8.5 (GLOVE) ×1 IMPLANT
GLOVE SUPERSENSE BIOGEL SZ 8.5 (GLOVE) ×1
GOWN STRL REUS W/ TWL LRG LVL3 (GOWN DISPOSABLE) ×2 IMPLANT
GOWN STRL REUS W/TWL 2XL LVL3 (GOWN DISPOSABLE) ×2 IMPLANT
GOWN STRL REUS W/TWL LRG LVL3 (GOWN DISPOSABLE) ×2
IV CATH 18G X1.75 CATHLON (IV SOLUTION) ×1 IMPLANT
KIT BASIN OR (CUSTOM PROCEDURE TRAY) ×2 IMPLANT
KIT TURNOVER KIT B (KITS) ×2 IMPLANT
NDL 18GX1X1/2 (RX/OR ONLY) (NEEDLE) IMPLANT
NDL SPNL 18GX3.5 QUINCKE PK (NEEDLE) ×2 IMPLANT
NEEDLE 18GX1X1/2 (RX/OR ONLY) (NEEDLE) ×2 IMPLANT
NEEDLE 22X1 1/2 (OR ONLY) (NEEDLE) ×2 IMPLANT
NEEDLE SPNL 18GX3.5 QUINCKE PK (NEEDLE) ×4 IMPLANT
NS IRRIG 1000ML POUR BTL (IV SOLUTION) ×2 IMPLANT
PACK LAMINECTOMY ORTHO (CUSTOM PROCEDURE TRAY) ×2 IMPLANT
PACK UNIVERSAL I (CUSTOM PROCEDURE TRAY) ×2 IMPLANT
PAD ARMBOARD 7.5X6 YLW CONV (MISCELLANEOUS) ×4 IMPLANT
PATTIES SURGICAL .5 X.5 (GAUZE/BANDAGES/DRESSINGS) ×1 IMPLANT
PATTIES SURGICAL .5 X1 (DISPOSABLE) ×1 IMPLANT
SPONGE SURGIFOAM ABS GEL 100 (HEMOSTASIS) ×1 IMPLANT
SPONGE T-LAP 4X18 ~~LOC~~+RFID (SPONGE) ×5 IMPLANT
SURGIFLO W/THROMBIN 8M KIT (HEMOSTASIS) ×1 IMPLANT
SUT BONE WAX W31G (SUTURE) ×2 IMPLANT
SUT MNCRL AB 3-0 PS2 27 (SUTURE) ×1 IMPLANT
SUT MNCRL+ AB 3-0 CT1 36 (SUTURE) ×1 IMPLANT
SUT MONOCRYL AB 3-0 CT1 36IN (SUTURE)
SUT VIC AB 1 CT1 18XCR BRD 8 (SUTURE) ×1 IMPLANT
SUT VIC AB 1 CT1 8-18 (SUTURE) ×2
SUT VIC AB 2-0 CT1 18 (SUTURE) ×1 IMPLANT
SYR BULB IRRIG 60ML STRL (SYRINGE) ×2 IMPLANT
SYR CONTROL 10ML LL (SYRINGE) ×2 IMPLANT
SYR TB 1ML LUER SLIP (SYRINGE) ×1 IMPLANT
TOWEL GREEN STERILE (TOWEL DISPOSABLE) ×2 IMPLANT
TOWEL GREEN STERILE FF (TOWEL DISPOSABLE) ×2 IMPLANT
WATER STERILE IRR 1000ML POUR (IV SOLUTION) ×2 IMPLANT
YANKAUER SUCT BULB TIP NO VENT (SUCTIONS) IMPLANT

## 2022-01-03 NOTE — Transfer of Care (Signed)
Immediate Anesthesia Transfer of Care Note  Patient: Chase Riley  Procedure(s) Performed: LUMBAR TWO THROUGH THREE DECOMPRESSION AND DISCECTOMY (Left: Spine Lumbar)  Patient Location: PACU  Anesthesia Type:General  Level of Consciousness: awake, alert  and oriented  Airway & Oxygen Therapy: Patient Spontanous Breathing and Patient connected to nasal cannula oxygen  Post-op Assessment: Report given to RN and Post -op Vital signs reviewed and stable  Post vital signs: Reviewed and stable  Last Vitals:  Vitals Value Taken Time  BP 138/55 01/03/22 1746  Temp    Pulse 90 01/03/22 1748  Resp 20 01/03/22 1748  SpO2 95 % 01/03/22 1748  Vitals shown include unvalidated device data.  Last Pain:  Vitals:   01/03/22 1411  TempSrc: Oral  PainSc: 8          Complications: No notable events documented.

## 2022-01-03 NOTE — H&P (Signed)
History:   Chase Riley presents today with a 3-1/2-week history of severe radicular left thigh pain. Patient has been to the emergency room as well as to our urgent care where an MRI was requested. Patient presents now with horrific radicular leg pain.  Imaging study clearly demonstrates a large posterior lateral to the left L2-3 disc herniation causing compression of the traversing L3 nerve root. Patient has a positive femoral stretch test as well as loss of normal sensation (neurological deficits) in the L3 dermatome. I have gone over the imaging studies, and clinical exam with the patient and his wife and all of their questions were addressed.  At this point time we discussed treatment options which include injection therapy, physical therapy, and surgical intervention. Because of his severe pain and neurological deficit he is elected to move forward with surgery. I do believe this is a reasonable treatment option for him. Surgical plan would be a lumbar microdiscectomy at L2-3.  Past Medical History:  Diagnosis Date   Anemia    history of   Chronic kidney disease    Stage 4   Diabetes mellitus without complication ([place])    type 2   Hypertension     Allergies  Allergen Reactions   Garlic Hives and Other (See Comments)    Gas, too   Onion Hives and Other (See Comments)    Gas, too    No current facility-administered medications on file prior to encounter.   Current Outpatient Medications on File Prior to Encounter  Medication Sig Dispense Refill   acetaminophen (TYLENOL) 500 MG tablet Take 500 mg by mouth every 6 (six) hours as needed for mild pain or headache.     COLACE 100 MG capsule Take 100 mg by mouth 2 (two) times daily as needed for mild constipation.     gabapentin (NEURONTIN) 300 MG capsule Take 300 mg by mouth in the morning and at bedtime.     methocarbamol (ROBAXIN) 500 MG tablet Take 1 tablet (500 mg total) by mouth every 8 (eight) hours as needed for muscle spasms.  (Patient taking differently: Take 500 mg by mouth in the morning and at bedtime.) 30 tablet 1   olmesartan-hydrochlorothiazide (BENICAR HCT) 40-25 MG tablet Take 1 tablet by mouth daily.     rosuvastatin (CRESTOR) 40 MG tablet Take 40 mg by mouth daily.     RYBELSUS 3 MG TABS Take 3 mg by mouth daily in the afternoon.     traMADol (ULTRAM) 50 MG tablet Take 50 mg by mouth See admin instructions. Take 50 mg by mouth at 2 PM daily     Vitamin D, Ergocalciferol, (DRISDOL) 1.25 MG (50000 UNIT) CAPS capsule Take 50,000 Units by mouth every Wednesday.     albuterol (VENTOLIN HFA) 108 (90 Base) MCG/ACT inhaler Inhale 1-2 puffs into the lungs every 6 (six) hours as needed for wheezing or shortness of breath.     allopurinol (ZYLOPRIM) 100 MG tablet Take 100 mg by mouth in the morning and at bedtime.     lidocaine (LIDODERM) 5 % Place 1 patch onto the skin daily. Remove & Discard patch within 12 hours or as directed by MD (Patient not taking: Reported on 12/30/2021) 5 patch 0   polyethylene glycol (MIRALAX) 17 g packet Take 17 g by mouth daily. (Patient not taking: Reported on 12/30/2021) 30 each 1    Physical Exam: Vitals:   01/03/22 1411  BP: (!) 154/76  Pulse: 99  Resp: 18  Temp: 97.6  F (36.4 C)  SpO2: 93%   There is no height or weight on file to calculate BMI.  Clinical exam: Chase Riley is a pleasant individual, who appears younger than their stated age.  He is alert and orientated 3.  No shortness of breath, chest pain.  Abdomen is soft and non-tender, negative loss of bowel and bladder control, no rebound tenderness.  Negative: skin lesions abrasions contusions  Peripheral pulses: 2+ peripheral pulses bilaterally. LE compartments are: Soft and nontender.  Gait pattern: Limited ambulation due to severe left anterior thigh pain  Assistive devices: Patient is using a cane and single crutch to assist with ambulation  Neuro: Positive femoral stretch test on the left side. 5/5 motor  strength in the lower extremity. Loss of normal sensation in the left L3 dermatome. Negative Babinski test, no clonus.  Musculoskeletal: Mild to moderate low back pain with palpation. No SI joint pain. No hip, knee, ankle pain with isolated joint range of motion.  Imaging: X-rays of the lumbar spine demonstrate mild multilevel degenerative disc disease but no scoliosis. No spondylolisthesis is noted.  Lumbar MRI: completed on 12/15/2021: large left posterior lateral disc herniation at L2-3 producing severe left lateral recess stenosis with compression of the traversing left L3 nerve root. Mild to moderate degenerative disc disease noted at this level. There is severe left foraminal stenosis at L3-4 causing further irritation to the exiting L3 nerve root. Moderate to severe facet arthropathy with a low-grade anterior listhesis at L4-5 but only mild to moderate stenosis. Near complete rudimentary disc at L5-S1. Patient does have transitional anatomy.   A/P: Summary: Chase Riley presents today with a 3-1/2-week history of severe radicular left thigh pain. Patient has been to the emergency room as well as to our urgent care where an MRI was requested. Patient presents now with horrific radicular leg pain Imaging study clearly demonstrates a large posterior lateral to the left L2-3 disc herniation causing compression of the traversing L3 nerve root. Patient has a positive femoral stretch test as well as loss of normal sensation (neurological deficits) in the L3 dermatome. I have gone over the imaging studies, and clinical exam with the patient and his wife and all of their questions were addressed. At this point time we discussed treatment options which include injection therapy, physical therapy, and surgical intervention. Because of his severe pain and neurological deficit he is elected to move forward with surgery. I do believe this is a reasonable treatment option for him. Surgical plan would be a lumbar  microdiscectomy at L2-3. Risks and benefits of decompression/discectomy: Infection, bleeding, death, stroke, paralysis, ongoing or worse pain, need for additional surgery, leak of spinal fluid, adjacent segment degeneration requiring additional surgery, post-operative hematoma formation that can result in neurological compromise and the need for urgent/emergent re-operation. Loss in bowel and bladder control. Injury to major vessels that could result in the need for urgent abdominal surgery to stop bleeding. Risk of deep venous thrombosis (DVT) and the need for additional treatment. Recurrent disc herniation resulting in the need for revision surgery, which could include fusion surgery (utilizing instrumentation such as pedicle screws and intervertebral cages). Additional risk: If instrumentation is used there is a risk of migration, or breakage of that hardware that could require additional surgery.

## 2022-01-03 NOTE — Anesthesia Procedure Notes (Addendum)
Procedure Name: Intubation Date/Time: 01/03/2022 3:52 PM  Performed by: Dorthea Cove, CRNAPre-anesthesia Checklist: Patient identified, Emergency Drugs available, Suction available and Patient being monitored Patient Re-evaluated:Patient Re-evaluated prior to induction Oxygen Delivery Method: Circle System Utilized Preoxygenation: Pre-oxygenation with 100% oxygen Induction Type: IV induction Ventilation: Mask ventilation without difficulty Laryngoscope Size: Mac and 4 Grade View: Grade I Tube type: Oral Tube size: 7.5 mm Number of attempts: 1 Airway Equipment and Method: Stylet and Bite block Placement Confirmation: ETT inserted through vocal cords under direct vision, positive ETCO2 and breath sounds checked- equal and bilateral Secured at: 21 cm Tube secured with: Tape Dental Injury: Teeth and Oropharynx as per pre-operative assessment  Comments: Intubated by SRNA.

## 2022-01-03 NOTE — Brief Op Note (Signed)
01/03/2022  5:40 PM  PATIENT:  Chase Riley  81 y.o. male  PRE-OPERATIVE DIAGNOSIS:  Left L2-3 herniated disc with radiculopathy  POST-OPERATIVE DIAGNOSIS:  Left L2-3 herniated disc with radiculopathy  PROCEDURE:  Procedure(s) with comments: LUMBAR TWO THROUGH THREE DECOMPRESSION AND DISCECTOMY (Left) - 2.5 hrs 3 C-Bed  SURGEON:  Surgeon(s) and Role:    Melina Schools, MD - Primary  PHYSICIAN ASSISTANT:   ASSISTANTS: none   ANESTHESIA:   general  EBL:  75 mL   BLOOD ADMINISTERED:none  DRAINS: none   LOCAL MEDICATIONS USED:  MARCAINE     SPECIMEN:  No Specimen  DISPOSITION OF SPECIMEN:  N/A  COUNTS:  YES  TOURNIQUET:  * No tourniquets in log *  DICTATION: .Dragon Dictation  PLAN OF CARE: Admit for overnight observation  PATIENT DISPOSITION:  PACU - hemodynamically stable.

## 2022-01-03 NOTE — Op Note (Signed)
OPERATIVE REPORT  DATE OF SURGERY: 01/03/2022  PATIENT NAME:  Dandra Velardi MRN: [id number] DOB: [birth year]  PCP: System, Provider Not In  PRE-OPERATIVE DIAGNOSIS: L2-3 disc herniation with L3 radiculopathy  POST-OPERATIVE DIAGNOSIS: Same  PROCEDURE:   L2-3 decompression with discectomy left side  SURGEON:  Melina Schools, MD  PHYSICIAN ASSISTANT: None  ANESTHESIA:   General  EBL: 75 ml   Complications: None  BRIEF HISTORY: Thorn Demas is a 81 y.o. male who is under my office with severe back buttock and radicular left leg pain.  He had weakness in his thigh and difficulty ambulating.  Imaging studies confirmed a posterior lateral to the left disc herniation at L2-3 causing L3 nerve root compression.  Attempted conservative management failed to alleviate his pain and so he elected to proceed with surgery.  All appropriate risks benefits and alternatives were discussed with the patient and consent was obtained  PROCEDURE DETAILS: Patient was brought into the operating room and was properly positioned on the operating room table.  After induction with general anesthesia the patient was endotracheally intubated.  A timeout was taken to confirm all important data: including patient, procedure, and the level. Teds, SCD's were applied.   Patient was placed prone onto the Wilson frame and all bony prominences well-padded.  2 needles were placed in the back and an x-ray was taken for localization of the skin incision.  I marked out my incision and infiltrated with quarter percent Marcaine with epinephrine.  Midline incision was made centered over the L2-3 disc space and I sharply dissected down to the deep fascia.  I incised the deep fascia and stripped the paraspinal muscles to expose the spinous process of L2 bilaterally and the lamina.  I also exposed the L3 spinous process.  A Penfield 4 was placed underneath the L2 lamina and a second x-ray was taken confirming I was at the appropriate  level.  Self-retaining retractors were placed and a double-action Leksell rongeur was used to remove the spinous process ligament and the inferior third of the L2 spinous process.  I then used a 3 mm Kerrison rongeur to perform a laminotomy on the left side of L2.  I then used my and feel for to dissect through the central raphae of the ligamentum flavum and I performed a central decompression by removing the ligamentum flavum with a 3 mm Kerrison rongeur.  I then palpated down to the right side to ensure there was no significant stenosis.  I then palpated down into the left lateral recess removing the ligamentum flavum.  Once I was able to develop a plane between the thecal sac and the medial aspect of the facet complex I used a 2 mm Kerrison rongeur to perform a medial facetectomy.  I took this decompression out laterally so I could see the medial aspect of the L2-3 pedicle.  I could now visualize the L3 nerve root.  The nerve root was under significant tension and was not very mobile.  I then gently began to mobilize the thecal sac medially and encountered very large epidural veins.  These were coagulated and then packed off with a neuro patty with Gelfoam.  At this point the disc herniation came into visualization.  Using a Penfield 4 I dissected and then entered into the disc fragment.  Using nerve hooks I delivered multiple large fragments of disc material into the lateral recess and removed with a micropituitary rongeurs.  At this point the L3 nerve root was now freely mobile.  I did trim down the leading edge of the L3 lamina in order to adequately decompress the L3 nerve root.  Foraminotomy was also performed.  At this point I could easily sweep my nerve hook under the L3 nerve root out into the L3 foramen and medially underneath the thecal sac and superiorly in the lateral recess.  All fragments of disc material removed.  The amount of disc material removed was consistent with what was seen on the  preoperative MRI.  At this point I irrigated the wound copiously normal saline and using bipolar cautery and Floseal I obtained hemostasis.  The neuro patties and Gelfoam were removed and the wound was irrigated copiously with normal saline.  After confirming hemostasis I then placed 20 mg (1/2 cc) of Depo-Medrol over the nerve root to aid in postoperative analgesia.  Thrombin-soaked Gelfoam patty was placed over the laminotomy defect and the retractors were removed.  I closed the deep fascia with interrupted #1 Vicryl suture, then a layer of interrupted 2-0 Vicryl suture, and the skin was reapproximated with 3-0 Monocryl.  At the end of the case all needle sponge counts were correct.  There were no adverse intraoperative events.  Melina Schools, MD 01/03/2022 5:34 PM

## 2022-01-03 NOTE — OR Nursing (Signed)
L2-L3 verified by radiologist, Dr. Armandina Gemma

## 2022-01-04 ENCOUNTER — Encounter (HOSPITAL_COMMUNITY): Payer: Self-pay | Admitting: Orthopedic Surgery

## 2022-01-04 DIAGNOSIS — I129 Hypertensive chronic kidney disease with stage 1 through stage 4 chronic kidney disease, or unspecified chronic kidney disease: Secondary | ICD-10-CM | POA: Diagnosis not present

## 2022-01-04 DIAGNOSIS — N184 Chronic kidney disease, stage 4 (severe): Secondary | ICD-10-CM | POA: Diagnosis not present

## 2022-01-04 DIAGNOSIS — Z79899 Other long term (current) drug therapy: Secondary | ICD-10-CM | POA: Diagnosis not present

## 2022-01-04 DIAGNOSIS — M5116 Intervertebral disc disorders with radiculopathy, lumbar region: Secondary | ICD-10-CM | POA: Diagnosis not present

## 2022-01-04 DIAGNOSIS — R2689 Other abnormalities of gait and mobility: Secondary | ICD-10-CM | POA: Diagnosis not present

## 2022-01-04 DIAGNOSIS — E1122 Type 2 diabetes mellitus with diabetic chronic kidney disease: Secondary | ICD-10-CM | POA: Diagnosis not present

## 2022-01-04 LAB — GLUCOSE, CAPILLARY: Glucose-Capillary: 157 mg/dL — ABNORMAL HIGH (ref 70–99)

## 2022-01-04 NOTE — Anesthesia Postprocedure Evaluation (Signed)
Anesthesia Post Note  Patient: Chase Riley  Procedure(s) Performed: LUMBAR TWO THROUGH THREE DECOMPRESSION AND DISCECTOMY (Left: Spine Lumbar)     Patient location during evaluation: PACU Anesthesia Type: General Level of consciousness: awake and alert Pain management: pain level controlled Vital Signs Assessment: post-procedure vital signs reviewed and stable Respiratory status: spontaneous breathing, nonlabored ventilation, respiratory function stable and patient connected to nasal cannula oxygen Cardiovascular status: blood pressure returned to baseline and stable Postop Assessment: no apparent nausea or vomiting Anesthetic complications: no   No notable events documented.  Last Vitals:  Vitals:   01/04/22 0529 01/04/22 0801  BP: 111/64 (!) 99/55  Pulse: 79 80  Resp: 18 17  Temp: 37 C 36.6 C  SpO2: 95% 98%    Last Pain:  Vitals:   01/04/22 0801  TempSrc: Oral  PainSc:                  March Rummage Jacoya Bauman

## 2022-01-04 NOTE — Evaluation (Signed)
Occupational Therapy Evaluation Patient Details Name: Chase Riley MRN: [id number] DOB: 1940/06/19 Today's Date: 01/04/2022   History of Present Illness Pt is a 81 y/o M s/p L2-3 decompression with discectomy on L side, PMH includes anemia and CKD 4.   Clinical Impression   Pt needing assist at baseline for ADLs, using rollator for functional mobility, pt lives with spouse, reports being in the bed frequently at home for the past month due to pain. Pt states 2 other family members (retired RN's) will also be with him to assist at d/c. Pt currently mod I -mod A for ADLs, benefits from use of reacher for LB dressing. Pt min guard for bed mobility and transfers using Rollator. Pt educated on precautions (handout provided), brace wear, compensatory strategies for ADLs, car transfer, and adaptive equipment, pt verbalized understanding, adheres well to precautions during session. Pt presenting with impairments listed below, will follow acutely. Recommend d/c home with family assistance.     Recommendations for follow up therapy are one component of a multi-disciplinary discharge planning process, led by the attending physician.  Recommendations may be updated based on patient status, additional functional criteria and insurance authorization.   Follow Up Recommendations  No OT follow up    Assistance Recommended at Discharge Set up Supervision/Assistance  Patient can return home with the following A little help with walking and/or transfers;A lot of help with bathing/dressing/bathroom;Assist for transportation;Help with stairs or ramp for entrance;Assistance with cooking/housework    Functional Status Assessment  Patient has had a recent decline in their functional status and demonstrates the ability to make significant improvements in function in a reasonable and predictable amount of time.  Equipment Recommendations  None recommended by OT;Other (comment) (pt has all needed DME)     Recommendations for Other Services       Precautions / Restrictions Precautions Precautions: Back;Fall Precaution Booklet Issued: Yes (comment) Precaution Comments: educated pt on 3/3 back precautions Required Braces or Orthoses: Spinal Brace Spinal Brace: Lumbar corset Restrictions Weight Bearing Restrictions: No      Mobility Bed Mobility Overal bed mobility: Needs Assistance Bed Mobility: Sidelying to Sit, Sit to Sidelying   Sidelying to sit: Min guard     Sit to sidelying: Min guard General bed mobility comments: incr time for log roll technique    Transfers Overall transfer level: Needs assistance Equipment used: Rollator (4 wheels) Transfers: Sit to/from Stand Sit to Stand: Min guard                  Balance Overall balance assessment: Mild deficits observed, not formally tested                                         ADL either performed or assessed with clinical judgement   ADL Overall ADL's : Needs assistance/impaired Eating/Feeding: Modified independent   Grooming: Modified independent   Upper Body Bathing: Minimal assistance   Lower Body Bathing: Moderate assistance;Maximal assistance;Cueing for compensatory techniques   Upper Body Dressing : Minimal assistance   Lower Body Dressing: Moderate assistance;Maximal assistance;Cueing for compensatory techniques   Toilet Transfer: Min guard;Ambulation;Regular Toilet;Rollator (4 wheels)   Toileting- Clothing Manipulation and Hygiene: Supervision/safety       Functional mobility during ADLs: Min guard;Rolling walker (2 wheels)       Vision   Vision Assessment?: No apparent visual deficits     Perception  Praxis      Pertinent Vitals/Pain Pain Assessment Pain Assessment: Faces Pain Score: 4  Faces Pain Scale: Hurts little more Pain Location: back at incision Pain Descriptors / Indicators: Sore Pain Intervention(s): Limited activity within patient's tolerance,  Monitored during session, Repositioned     Hand Dominance     Extremity/Trunk Assessment Upper Extremity Assessment Upper Extremity Assessment: Overall WFL for tasks assessed   Lower Extremity Assessment Lower Extremity Assessment: Generalized weakness   Cervical / Trunk Assessment Cervical / Trunk Assessment: Back Surgery   Communication Communication Communication: No difficulties   Cognition Arousal/Alertness: Awake/alert Behavior During Therapy: WFL for tasks assessed/performed Overall Cognitive Status: Within Functional Limits for tasks assessed                                       General Comments  VSS on RA    Exercises     Shoulder Instructions      Home Living Family/patient expects to be discharged to:: Private residence Living Arrangements: Spouse/significant other Available Help at Discharge: Family;Available 24 hours/day (has 2 other family members coming to assist (retired RN's)) Type of Home: House Home Access: Level entry     [place]: One level     Bathroom Shower/Tub: Walk-in Psychologist, prison and probation services: Handicapped height     [place]: Conservation officer, nature (2 wheels);BSC/3in1;Shower seat;Cane - single point          Prior Functioning/Environment Prior Level of Function : Needs assist             Mobility Comments: used rollator ADLs Comments: wife assists with ADLs, pt reporting mostly being in the bed for the past month due to ?pain/wife not wanting him to "over do it"        OT Problem List: Decreased strength;Decreased range of motion;Decreased activity tolerance;Impaired balance (sitting and/or standing);Decreased knowledge of precautions      OT Treatment/Interventions: Self-care/ADL training;Therapeutic exercise;Therapeutic activities;Patient/family education;Balance training;DME and/or AE instruction;Energy conservation    OT Goals(Current goals can be found in the care plan section) Acute Rehab OT  Goals Patient Stated Goal: none stated OT Goal Formulation: With patient Time For Goal Achievement: 01/18/22 Potential to Achieve Goals: Good ADL Goals Pt Will Perform Tub/Shower Transfer: Shower transfer;shower seat;rolling walker;ambulating  OT Frequency: Min 2X/week    Co-evaluation              AM-PAC OT "6 Clicks" Daily Activity     Outcome Measure Help from another person eating meals?: None Help from another person taking care of personal grooming?: None Help from another person toileting, which includes using toliet, bedpan, or urinal?: A Little Help from another person bathing (including washing, rinsing, drying)?: A Lot Help from another person to put on and taking off regular upper body clothing?: A Little Help from another person to put on and taking off regular lower body clothing?: A Lot 6 Click Score: 18   End of Session Equipment Utilized During Treatment: Rolling walker (2 wheels);Back brace Nurse Communication: Mobility status  Activity Tolerance: Patient tolerated treatment well Patient left: in bed;with call bell/phone within reach  OT Visit Diagnosis: Unsteadiness on feet (R26.81);Other abnormalities of gait and mobility (R26.89);Muscle weakness (generalized) (M62.81)                Time: 6283-1517 OT Time Calculation (min): 28 min Charges:  OT General Charges $OT Visit: 1 Visit OT Evaluation $  OT Eval Low Complexity: 1 Low OT Treatments $Self Care/Home Management : 8-22 mins  Lynnda Child, OTD, OTR/L Acute Rehab [phone] - [place] 01/04/2022, 8:52 AM

## 2022-01-04 NOTE — Progress Notes (Signed)
    Subjective: Procedure(s) (LRB): LUMBAR TWO THROUGH THREE DECOMPRESSION AND DISCECTOMY (Left) 1 Day Post-Op  Patient reports pain as 1 on 0-10 scale.  Reports decreased leg pain reports incisional back pain   Positive void Negative bowel movement Positive flatus Negative chest pain or shortness of breath  Objective: Vital signs in last 24 hours: Temp:  [97.2 F (36.2 C)-98.8 F (37.1 C)] 97.8 F (36.6 C) (08/01 0801) Pulse Rate:  [76-99] 80 (08/01 0801) Resp:  [12-21] 17 (08/01 0801) BP: (99-154)/(55-80) 99/55 (08/01 0801) SpO2:  [88 %-98 %] 98 % (08/01 0801)  Intake/Output from previous day: 07/31 0701 - 08/01 0700 In: 1000 [I.V.:500; IV Piggyback:500] Out: 75 [Blood:75]  Labs: No results for input(s): "WBC", "RBC", "HCT", "PLT" in the last 72 hours. No results for input(s): "NA", "K", "CL", "CO2", "BUN", "CREATININE", "GLUCOSE", "CALCIUM" in the last 72 hours. No results for input(s): "LABPT", "INR" in the last 72 hours.  Physical Exam: Neurologically intact ABD soft Intact pulses distally Incision: dressing C/D/I and no drainage Compartment soft There is no height or weight on file to calculate BMI.   Assessment/Plan: Patient stable  Continue mobilization with physical therapy Continue care  Up with therapy Patient doing well.  Plan on d/c to home today. Instructions and medications provided.  Will f/u with me in 2 weeks  Melina Schools, MD [facility] [phone]

## 2022-01-04 NOTE — Discharge Summary (Signed)
Patient ID: Chase Riley MRN: [id number] DOB/AGE: 1941/01/03 81 y.o.  Admit date: 01/03/2022 Discharge date: 01/04/2022  Admission Diagnoses:  Principal Problem:   Lumbar disc herniation   Discharge Diagnoses:  Principal Problem:   Lumbar disc herniation  status post Procedure(s): LUMBAR TWO THROUGH THREE DECOMPRESSION AND DISCECTOMY  Past Medical History:  Diagnosis Date   Anemia    history of   Chronic kidney disease    Stage 4   Diabetes mellitus without complication ([place])    type 2   Hypertension     Surgeries: Procedure(s): LUMBAR TWO THROUGH THREE DECOMPRESSION AND DISCECTOMY on 01/03/2022   Consultants:   Discharged Condition: Improved  Hospital Course: Chase Riley is an 81 y.o. male who was admitted 01/03/2022 for operative treatment of Lumbar disc herniation. Patient failed conservative treatments (please see the history and physical for the specifics) and had severe unremitting pain that affects sleep, daily activities and work/hobbies. After pre-op clearance, the patient was taken to the operating room on 01/03/2022 and underwent  Procedure(s): LUMBAR TWO THROUGH THREE DECOMPRESSION AND DISCECTOMY.    Patient was given perioperative antibiotics:  Anti-infectives (From admission, onward)    Start     Dose/Rate Route Frequency Ordered Stop   01/04/22 0000  ceFAZolin (ANCEF) IVPB 1 g/50 mL premix        1 g 100 mL/hr over 30 Minutes Intravenous Every 8 hours 01/03/22 1942 01/04/22 0635   01/03/22 1500  ceFAZolin (ANCEF) IVPB 2g/100 mL premix        2 g 200 mL/hr over 30 Minutes Intravenous 30 min pre-op 01/03/22 1400 01/03/22 1613        Patient was given sequential compression devices and early ambulation to prevent DVT.   Patient benefited maximally from hospital stay and there were no complications. At the time of discharge, the patient was urinating/moving their bowels without difficulty, tolerating a regular diet, pain is controlled with oral  pain medications and they have been cleared by PT/OT.   Recent vital signs: Patient Vitals for the past 24 hrs:  BP Temp Temp src Pulse Resp SpO2  01/04/22 0801 (!) 99/55 97.8 F (36.6 C) Oral 80 17 98 %  01/04/22 0529 111/64 98.6 F (37 C) Oral 79 18 95 %  01/03/22 2329 108/64 98.4 F (36.9 C) Oral 92 20 93 %  01/03/22 1951 139/69 97.7 F (36.5 C) Oral 82 20 (!) 88 %  01/03/22 1930 (!) 121/56 -- -- 76 12 93 %  01/03/22 1915 (!) 108/58 (!) 97.2 F (36.2 C) -- 80 19 94 %  01/03/22 1900 (!) 106/59 -- -- 79 (!) 21 95 %  01/03/22 1845 (!) 115/59 -- -- 77 14 96 %  01/03/22 1830 (!) 115/56 -- -- 78 19 97 %  01/03/22 1815 (!) 119/57 -- -- 80 12 96 %  01/03/22 1800 119/80 -- -- 83 15 96 %  01/03/22 1748 (!) 138/55 98.8 F (37.1 C) -- 92 19 95 %  01/03/22 1745 (!) 138/55 -- -- 89 -- 97 %  01/03/22 1411 (!) 154/76 97.6 F (36.4 C) Oral 99 18 93 %     Recent laboratory studies: No results for input(s): "WBC", "HGB", "HCT", "PLT", "NA", "K", "CL", "CO2", "BUN", "CREATININE", "GLUCOSE", "INR", "CALCIUM" in the last 72 hours.  Invalid input(s): "PT", "2"   Discharge Medications:   Allergies as of 01/04/2022       Reactions   Garlic Hives, Other (See Comments)   Gas, too  Onion Hives, Other (See Comments)   Gas, too        Medication List     STOP taking these medications    acetaminophen 500 MG tablet Commonly known as: TYLENOL   gabapentin 300 MG capsule Commonly known as: NEURONTIN   lidocaine 5 % Commonly known as: Lidoderm   traMADol 50 MG tablet Commonly known as: ULTRAM   Vitamin D (Ergocalciferol) 1.25 MG (50000 UNIT) Caps capsule Commonly known as: DRISDOL       TAKE these medications    albuterol 108 (90 Base) MCG/ACT inhaler Commonly known as: VENTOLIN HFA Inhale 1-2 puffs into the lungs every 6 (six) hours as needed for wheezing or shortness of breath.   allopurinol 100 MG tablet Commonly known as: ZYLOPRIM Take 100 mg by mouth in the morning and  at bedtime.   Colace 100 MG capsule Generic drug: docusate sodium Take 100 mg by mouth 2 (two) times daily as needed for mild constipation.   methocarbamol 500 MG tablet Commonly known as: ROBAXIN Take 1 tablet (500 mg total) by mouth every 8 (eight) hours as needed for up to 5 days for muscle spasms. What changed:  when to take this reasons to take this   olmesartan-hydrochlorothiazide 40-25 MG tablet Commonly known as: BENICAR HCT Take 1 tablet by mouth daily.   ondansetron 4 MG tablet Commonly known as: Zofran Take 1 tablet (4 mg total) by mouth every 8 (eight) hours as needed for nausea or vomiting.   oxyCODONE-acetaminophen 10-325 MG tablet Commonly known as: Percocet Take 1 tablet by mouth every 6 (six) hours as needed for up to 5 days for pain.   polyethylene glycol 17 g packet Commonly known as: MiraLax Take 17 g by mouth daily.   rosuvastatin 40 MG tablet Commonly known as: CRESTOR Take 40 mg by mouth daily.   Rybelsus 3 MG Tabs Generic drug: Semaglutide Take 3 mg by mouth daily in the afternoon.        Diagnostic Studies: DG Lumbar Spine 1 View  Result Date: 01/03/2022 CLINICAL DATA:  Provided history: Patient under anesthesia. Radiologist read levels. EXAM: LUMBAR SPINE - 1 VIEW COMPARISON:  Intraoperative radiograph of the lumbar spine performed earlier today 01/03/2022. CT abdomen/pelvis 10/29/2021. FINDINGS: A single lateral view intraoperative radiograph of the lumbar spine is submitted. Transitional lumbosacral anatomy is noted. Spinal numbering will remain consistent with that utilized on the report for the intraoperative lumbar spine radiograph performed earlier today. By this numbering scheme, the lowest well-formed intervertebral disc space is designated L5-S1 and ribs are absent at the T12 level. Please see annotations on image. A surgical instrument projects posterior to the L2-L3 disc space. Retractors project posterior to the L3-L4 disc space. These  results were called by telephone at the time of interpretation to OR 4 (Dr. Chrissie Noa made aware) at 4:55 p.m. on 01/03/2022. IMPRESSION: Single lateral view intraoperative radiograph of the lumbar spine, as described. Electronically Signed   By: Kellie Simmering D.O.   On: 01/03/2022 17:08   DG Lumbar Spine 1 View  Result Date: 01/03/2022 CLINICAL DATA:  Intraoperative spine level localization. EXAM: LUMBAR SPINE - 1 VIEW COMPARISON:  CT AP 10/29/2021 FINDINGS: Stable lateral portable radiograph of the lumbar spine was obtained in the operating room. Lumbar spine levels have been annotated. A surgical probe is identified posterior to the L3 vertebral body. IMPRESSION: Surgical probe is posterior to the L3 vertebral body. These results were called by telephone at the time of interpretation on  01/03/2022 at 4:37 pm to provider [facility] Shenelle Klas in [place] [facility] AT [phone]. Electronically Signed   By: Kerby Moors M.D.   On: 01/03/2022 16:38    Discharge Instructions     Incentive spirometry RT   Complete by: As directed         Follow-up Information     Melina Schools, MD Follow up in 2 week(s).   Specialty: Orthopedic Surgery Why: If symptoms worsen, For suture removal, For wound re-check Contact information: [address] [place] [address] [phone]                 Discharge Plan:  discharge to home  Disposition: Patient doing well. Cleared for discharge from PT/OT standpoint.  Instructions and post-operative medications provided. He will follow up with me in 2 weeks for wound check.    Signed: Dahlia Bailiff for Dr. Melina Schools [facility] [phone] 01/04/2022, 10:35 AM

## 2022-01-04 NOTE — Progress Notes (Signed)
Patient alert and oriented, mae's well, voiding adequate amount of urine, swallowing without difficulty, no c/o pain at time of discharge. Patient discharged home with family. Script and discharged instructions given to patient. Patient and family stated understanding of instructions given. Patient has an appointment with Dr. Brooks in 2 weeks 

## 2022-01-04 NOTE — Progress Notes (Signed)
PT Cancellation Note and Discharge  Patient Details Name: Chase Riley MRN: [id number] DOB: 1941-03-16   Cancelled Treatment:    Reason Eval/Treat Not Completed: PT screened, no needs identified, will sign off. Discussed pt case with OT who reports pt is currently mobilizing grossly at a min guard level and is overall improved from PTA. Pt does not require a formal PT evaluation at this time. PT signing off. If needs change, please reconsult.     Thelma Comp 01/04/2022, 12:37 PM  Rolinda Roan, PT, DPT Acute Rehabilitation Services Secure Chat Preferred Office: [phone]

## 2022-02-14 DIAGNOSIS — M5451 Vertebrogenic low back pain: Secondary | ICD-10-CM | POA: Diagnosis not present

## 2022-02-17 DIAGNOSIS — M5451 Vertebrogenic low back pain: Secondary | ICD-10-CM | POA: Diagnosis not present

## 2022-02-22 DIAGNOSIS — M5451 Vertebrogenic low back pain: Secondary | ICD-10-CM | POA: Diagnosis not present

## 2022-02-24 DIAGNOSIS — M5451 Vertebrogenic low back pain: Secondary | ICD-10-CM | POA: Diagnosis not present

## 2022-03-01 DIAGNOSIS — M5451 Vertebrogenic low back pain: Secondary | ICD-10-CM | POA: Diagnosis not present

## 2022-03-03 DIAGNOSIS — M5451 Vertebrogenic low back pain: Secondary | ICD-10-CM | POA: Diagnosis not present

## 2022-03-08 DIAGNOSIS — M5451 Vertebrogenic low back pain: Secondary | ICD-10-CM | POA: Diagnosis not present

## 2022-03-10 DIAGNOSIS — M5451 Vertebrogenic low back pain: Secondary | ICD-10-CM | POA: Diagnosis not present

## 2022-03-15 DIAGNOSIS — M5451 Vertebrogenic low back pain: Secondary | ICD-10-CM | POA: Diagnosis not present

## 2022-03-19 DIAGNOSIS — Z23 Encounter for immunization: Secondary | ICD-10-CM | POA: Diagnosis not present

## 2022-03-25 ENCOUNTER — Other Ambulatory Visit: Payer: Self-pay | Admitting: Family Medicine

## 2022-03-25 DIAGNOSIS — J984 Other disorders of lung: Secondary | ICD-10-CM | POA: Diagnosis not present

## 2022-03-25 DIAGNOSIS — N1831 Chronic kidney disease, stage 3a: Secondary | ICD-10-CM | POA: Diagnosis not present

## 2022-03-25 DIAGNOSIS — E1122 Type 2 diabetes mellitus with diabetic chronic kidney disease: Secondary | ICD-10-CM | POA: Diagnosis not present

## 2022-03-25 DIAGNOSIS — E785 Hyperlipidemia, unspecified: Secondary | ICD-10-CM | POA: Diagnosis not present

## 2022-03-25 DIAGNOSIS — R748 Abnormal levels of other serum enzymes: Secondary | ICD-10-CM | POA: Diagnosis not present

## 2022-03-25 DIAGNOSIS — R143 Flatulence: Secondary | ICD-10-CM | POA: Diagnosis not present

## 2022-03-31 DIAGNOSIS — E785 Hyperlipidemia, unspecified: Secondary | ICD-10-CM | POA: Diagnosis not present

## 2022-03-31 DIAGNOSIS — N1831 Chronic kidney disease, stage 3a: Secondary | ICD-10-CM | POA: Diagnosis not present

## 2022-03-31 DIAGNOSIS — E1122 Type 2 diabetes mellitus with diabetic chronic kidney disease: Secondary | ICD-10-CM | POA: Diagnosis not present

## 2022-04-04 ENCOUNTER — Ambulatory Visit
Admission: RE | Admit: 2022-04-04 | Discharge: 2022-04-04 | Disposition: A | Discharge status: Home / Self Care | Payer: Medicare Other | Source: Ambulatory Visit | Attending: Family Medicine | Admitting: Family Medicine

## 2022-04-04 DIAGNOSIS — I700 Atherosclerosis of aorta: Secondary | ICD-10-CM | POA: Diagnosis not present

## 2022-04-04 DIAGNOSIS — J984 Other disorders of lung: Secondary | ICD-10-CM

## 2022-04-04 DIAGNOSIS — R918 Other nonspecific abnormal finding of lung field: Secondary | ICD-10-CM | POA: Diagnosis not present

## 2022-04-04 DIAGNOSIS — J8410 Pulmonary fibrosis, unspecified: Secondary | ICD-10-CM | POA: Diagnosis not present

## 2022-04-22 ENCOUNTER — Encounter: Payer: Self-pay | Admitting: Pulmonary Disease

## 2022-04-22 ENCOUNTER — Ambulatory Visit (INDEPENDENT_AMBULATORY_CARE_PROVIDER_SITE_OTHER): Payer: Medicare Other | Admitting: Pulmonary Disease

## 2022-04-22 VITALS — BP 138/62 | HR 64 | Temp 97.7°F | Ht 73.5 in | Wt 206.3 lb

## 2022-04-22 DIAGNOSIS — J849 Interstitial pulmonary disease, unspecified: Secondary | ICD-10-CM

## 2022-04-22 NOTE — Patient Instructions (Addendum)
We will get labs today including antinuclear antibody, anti-Ro/SSA and anti-La/SSB antibodies, and rheumatoid factor and VEGF-D levels Will also get pulmonary function test for better evaluation of the lung Order high-res CT in 6 months. Return to clinic after PFTs in 1 to 2 months

## 2022-04-22 NOTE — Progress Notes (Signed)
Chase Riley    [id number]    1941/03/21  Primary Care Physician:System, Provider Not In  Referring Physician: Lurline Del, [address] [place],  [place] [address]  Chief complaint: Consult for cystic lung disease   HPI: 81 y.o. 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  No ongoing exposures.  No hot tub, Jacuzzi.  No feather pillows or comforter ILD questionnaire 04/22/2022-negative Smoking history: Never smoker Travel history: Previously lived in [place], [place] Relevant family history: No significant family history of lung disease  Outpatient Encounter Medications as of 04/22/2022  Medication Sig   ADVAIR HFA 115-21 MCG/ACT inhaler Inhale 2 puffs into the lungs 2 (two) times daily.   allopurinol (ZYLOPRIM) 100 MG tablet Take 100 mg by mouth in the morning and at bedtime.   olmesartan-hydrochlorothiazide (BENICAR HCT) 40-25 MG tablet Take 1 tablet by mouth daily.   rosuvastatin (CRESTOR) 40 MG tablet Take 40 mg by mouth daily.   RYBELSUS 3 MG TABS Take 3 mg by mouth daily in the afternoon.   albuterol (VENTOLIN HFA) 108 (90 Base) MCG/ACT inhaler Inhale 1-2 puffs into the lungs every 6 (six) hours as needed for wheezing or shortness of breath. (Patient not taking: Reported on 04/22/2022)   [DISCONTINUED] COLACE 100 MG capsule Take 100 mg by mouth 2 (two) times daily as needed for mild constipation.   [DISCONTINUED] ondansetron (ZOFRAN) 4 MG tablet Take 1 tablet (4 mg total) by mouth every 8 (eight) hours as needed for nausea or vomiting.    [DISCONTINUED] polyethylene glycol (MIRALAX) 17 g packet Take 17 g by mouth daily. (Patient not taking: Reported on 12/30/2021)   No facility-administered encounter medications on file as of 04/22/2022.    Allergies as of 04/22/2022 - Review Complete 04/22/2022  Allergen Reaction Noted   Garlic Hives and Other (See Comments) 12/24/2021   Onion Hives and Other (See Comments) 12/30/2021    Past Medical History:  Diagnosis Date   Anemia    history of   Chronic kidney disease    Stage 4   Diabetes mellitus without complication ([place])    type 2   Hypertension     Past Surgical History:  Procedure Laterality Date   KNEE ARTHROSCOPY Left 2016   LUMBAR LAMINECTOMY/DECOMPRESSION MICRODISCECTOMY Left 01/03/2022   Procedure: LUMBAR TWO THROUGH THREE DECOMPRESSION AND DISCECTOMY;  Surgeon: Melina Schools, MD;  Location: [facility];  Service: Orthopedics;  Laterality: Left;  2.5 hrs 3 C-Bed   SHOULDER ARTHROSCOPY Left 2008    No family history on file.  Social History   Socioeconomic History   Marital status: Married    Spouse name: Not on file   Number of children: 2   Years of education: Not on file   Highest education level: Not on file  Occupational History   Not on file  Tobacco Use   Smoking status: Never   Smokeless tobacco: Never  Vaping Use   Vaping Use: Never used  Substance and Sexual Activity   Alcohol use: Never   Drug use: Never   Sexual activity: Not on  file  Other Topics Concern   Not on file  Social History Narrative   Not on file   Social Determinants of Health   Financial Resource Strain: Not on file  Food Insecurity: Not on file  Transportation Needs: Not on file  Physical Activity: Not on file  Stress: Not on file  Social Connections: Not on file  Intimate Partner Violence: Not on file    Review of systems: Review of Systems  Constitutional: Negative for fever and chills.  HENT: Negative.   Eyes: Negative for blurred vision.  Respiratory: as  per HPI  Cardiovascular: Negative for chest pain and palpitations.  Gastrointestinal: Negative for vomiting, diarrhea, blood per rectum. Genitourinary: Negative for dysuria, urgency, frequency and hematuria.  Musculoskeletal: Negative for myalgias, back pain and joint pain.  Skin: Negative for itching and rash.  Neurological: Negative for dizziness, tremors, focal weakness, seizures and loss of consciousness.  Endo/Heme/Allergies: Negative for environmental allergies.  Psychiatric/Behavioral: Negative for depression, suicidal ideas and hallucinations.  All other systems reviewed and are negative.  Physical Exam: Blood pressure 138/62, pulse 64, temperature 97.7 F (36.5 C), temperature source Oral, height 6' 1.5" (1.867 m), weight 206 lb 4.8 oz (93.6 kg), SpO2 100 %. Gen:      No acute distress HEENT:  EOMI, sclera anicteric Neck:     No masses; no thyromegaly Lungs:    Clear to auscultation bilaterally; normal respiratory effort CV:         Regular rate and rhythm; no murmurs Abd:      + bowel sounds; soft, non-tender; no palpable masses, no distension Ext:    No edema; adequate peripheral perfusion Skin:      Warm and dry; no rash Neuro: alert and oriented x 3 Psych: normal mood and affect  Data Reviewed: Imaging: High-resolution CT 04/06/2022-cystic lung disease with multiple thin-walled cysts with no zonal distribution.  Mild bibasilar scarring.  I have reviewed the images personally.  PFTs:  Labs:  Assessment:  Evaluation for cystic lung disease CT findings are suggestive of LIP, LAM.  He does not have family history of skin lesions and kidney findings suggestive of [place] Lynelle Smoke We will start off with ANA, Ro, La, rheumatoid factor and VEGF- D levels Schedule PFTs for better evaluation of the lungs Consider genetic testing for  tuberous sclerosis complex, FLCN if above work-up is negative  Patient would prefer conservative management and will get CT in 6  months  Asthma Stable on advair Review PFTs when available  Plan/Recommendations: ANA, Ro, La, rheumatoid factor, VEGF-D PFTs CT in 6 months  Marshell Garfinkel MD [facility] 04/22/2022, 2:47 PM  CC: Lurline Del, DO

## 2022-04-25 ENCOUNTER — Other Ambulatory Visit: Payer: Medicare Other

## 2022-05-17 ENCOUNTER — Telehealth: Payer: Self-pay | Admitting: Pulmonary Disease

## 2022-05-17 NOTE — Telephone Encounter (Signed)
Mychart message sent. Nothing further needed

## 2022-05-21 DIAGNOSIS — Z23 Encounter for immunization: Secondary | ICD-10-CM | POA: Diagnosis not present

## 2022-05-27 ENCOUNTER — Ambulatory Visit: Payer: Medicare Other | Admitting: Acute Care

## 2022-05-27 ENCOUNTER — Ambulatory Visit (INDEPENDENT_AMBULATORY_CARE_PROVIDER_SITE_OTHER): Payer: Medicare Other | Admitting: Pulmonary Disease

## 2022-05-27 DIAGNOSIS — J849 Interstitial pulmonary disease, unspecified: Secondary | ICD-10-CM

## 2022-05-27 LAB — PULMONARY FUNCTION TEST
DL/VA % pred: 83 %
DL/VA: 3.21 ml/min/mmHg/L
DLCO cor % pred: 62 %
DLCO cor: 16.67 ml/min/mmHg
DLCO unc % pred: 62 %
DLCO unc: 16.67 ml/min/mmHg
FEF 25-75 Post: 1.88 L/sec
FEF 25-75 Pre: 2.27 L/sec
FEF2575-%Change-Post: -17 %
FEF2575-%Pred-Post: 81 %
FEF2575-%Pred-Pre: 98 %
FEV1-%Change-Post: -5 %
FEV1-%Pred-Post: 68 %
FEV1-%Pred-Pre: 72 %
FEV1-Post: 2.26 L
FEV1-Pre: 2.38 L
FEV1FVC-%Change-Post: 1 %
FEV1FVC-%Pred-Pre: 109 %
FEV6-%Change-Post: -6 %
FEV6-%Pred-Post: 65 %
FEV6-%Pred-Pre: 69 %
FEV6-Post: 2.81 L
FEV6-Pre: 2.99 L
FEV6FVC-%Change-Post: 0 %
FEV6FVC-%Pred-Post: 106 %
FEV6FVC-%Pred-Pre: 105 %
FVC-%Change-Post: -6 %
FVC-%Pred-Post: 61 %
FVC-%Pred-Pre: 65 %
FVC-Post: 2.83 L
FVC-Pre: 3.02 L
Post FEV1/FVC ratio: 80 %
Post FEV6/FVC ratio: 100 %
Pre FEV1/FVC ratio: 79 %
Pre FEV6/FVC Ratio: 99 %
RV % pred: 72 %
RV: 2.03 L
TLC % pred: 65 %
TLC: 5.05 L

## 2022-05-27 NOTE — Progress Notes (Signed)
PFT done today. 

## 2022-05-31 ENCOUNTER — Encounter: Payer: Self-pay | Admitting: Adult Health

## 2022-05-31 ENCOUNTER — Other Ambulatory Visit: Payer: Medicare Other

## 2022-05-31 ENCOUNTER — Ambulatory Visit (INDEPENDENT_AMBULATORY_CARE_PROVIDER_SITE_OTHER): Payer: Medicare Other | Admitting: Adult Health

## 2022-05-31 VITALS — BP 128/72 | HR 69 | Ht 73.5 in | Wt 205.6 lb

## 2022-05-31 DIAGNOSIS — J4520 Mild intermittent asthma, uncomplicated: Secondary | ICD-10-CM | POA: Diagnosis not present

## 2022-05-31 DIAGNOSIS — R9389 Abnormal findings on diagnostic imaging of other specified body structures: Secondary | ICD-10-CM | POA: Diagnosis not present

## 2022-05-31 DIAGNOSIS — J45909 Unspecified asthma, uncomplicated: Secondary | ICD-10-CM | POA: Insufficient documentation

## 2022-05-31 NOTE — Assessment & Plan Note (Signed)
Multiple round thin-walled cysts throughout the lungs on high-resolution CT chest.  Questionable etiology. Unfortunately lab work last visit appears to have been lost.  Will repeat today.  Check autoimmune panel. PFT showed mild to moderate restriction and a decreased diffusing past.  Patient has minimum symptoms.  Will continue to follow conservatively.  Has a repeat high-res CT in May plan.  Plan  Patient Instructions  Activity as tolerated.  Continue on Advair  Albuterol inhaler as needed  CT chest in May as planned.  Labs today  Follow up with Dr Vaughan Browner in May after CT scan

## 2022-05-31 NOTE — Addendum Note (Signed)
Addended by: Vanessa Barbara on: 05/31/2022 09:10 AM   Modules accepted: Orders

## 2022-05-31 NOTE — Assessment & Plan Note (Addendum)
History of mild intermittent asthma well-controlled on Advair.  PFTs today show mild to moderate restriction.  Appears to be under good control.  Continue on current regimen Vaccines are up-to-date Asthma action plan discussed

## 2022-05-31 NOTE — Patient Instructions (Addendum)
Activity as tolerated.  Continue on Advair  Albuterol inhaler as needed  CT chest in May as planned.  Labs today  Follow up with Dr Vaughan Browner in May after CT scan

## 2022-05-31 NOTE — Progress Notes (Signed)
$'@Patient'f$  ID: Chase Riley, male    DOB: 1941-01-10, 81 y.o.   MRN: [id number]  Chief Complaint  Patient presents with   Follow-up    Referring provider: No ref. provider found  HPI: -81 year old male never smoker seen for pulmonary consult April 22, 2022 for abnormal CT chest with multiple lung cysts.   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  No ongoing exposures.  No hot tub, Jacuzzi.  No feather pillows or comforter ILD questionnaire 04/22/2022-negative Smoking history: Never smoker Travel history: Previously lived in [place], [place] Relevant family history: No significant family history of lung disease  05/31/2022 Follow up : Abnormal CT chest-lung cyst Patient presents for 1 month follow-up.  Patient was seen last visit for a pulmonary consult.  He had had an initial CT abdomen and pelvis that showed a lung cyst.  A dedicated high-resolution CT chest was done April 04, 2022 and showed multiple round thin-walled cysts of varying size and without zonal distribution.  4 mm groundglass nodule in the right lower lobe.  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  Alternative diagnosis not UIP.  Patient is a never smoker.  He had minimum symptoms.  Was recommended to have PFTs that were done May 27, 2022 that showed moderate restriction and decreased diffusing capacity with FEV1 at 72%, ratio 79, FVC 65%, no significant bronchodilator response, DLCO 62%. Patient has a planned CT chest in 6 months. We reviewed his PFTs in detail. No significant cough , congestion, joint swelling, hemoptysis or edema. . No rash.  Retired Tourist information centre manager, Environmental consultant, worked in labs with chemicals.  Recent back surgery in August 2023, slowly getting back to active. Going back to gym 3 days a week Leaving for [place] in few weeks till April .  History of Asthma ,  on Advair Twice daily  for last few years. Rare use of Albuterol .     Allergies  Allergen Reactions   Garlic Hives and Other (See Comments)    Gas, too   Onion Hives and Other (See Comments)    Gas, too    Immunization History  Administered Date(s) Administered   Fluad Quad(high Dose 65+) 03/19/2022   PFIZER(Purple Top)SARS-COV-2 Vaccination 06/23/2019, 07/14/2019   Respiratory Syncytial Virus Vaccine,Recomb Aduvanted(Arexvy) 03/19/2022    Past Medical History:  Diagnosis Date   Anemia    history of   Chronic kidney disease    Stage 4   Diabetes mellitus without complication ([place])    type 2   Hypertension     Tobacco History: Social History   Tobacco Use  Smoking Status Never  Smokeless Tobacco Never   Counseling given: Not Answered   Outpatient Medications Prior to Visit  Medication Sig Dispense Refill   ADVAIR HFA 115-21 MCG/ACT inhaler Inhale 2 puffs into the lungs 2 (two) times daily.     albuterol (VENTOLIN HFA) 108 (90 Base) MCG/ACT inhaler Inhale 1-2 puffs into the lungs every 6 (six) hours as needed for wheezing or shortness of breath.     allopurinol (ZYLOPRIM) 100 MG tablet Take 100 mg by mouth in the morning and at bedtime.     olmesartan-hydrochlorothiazide (BENICAR HCT) 40-25 MG tablet Take 1 tablet by mouth daily.     rosuvastatin (CRESTOR) 40 MG tablet Take 40 mg by mouth daily.     RYBELSUS 3 MG TABS Take 3 mg by mouth  daily in the afternoon.     No facility-administered medications prior to visit.     Review of Systems:   Constitutional:   No  weight loss, night sweats,  Fevers, chills, fatigue, or  lassitude.  HEENT:   No headaches,  Difficulty swallowing,  Tooth/dental problems, or  Sore throat,                No sneezing, itching, ear ache, nasal congestion, post nasal drip,   CV:  No chest pain,  Orthopnea, PND, swelling in lower extremities, anasarca, dizziness, palpitations, syncope.   GI  No heartburn, indigestion, abdominal pain,  nausea, vomiting, diarrhea, change in bowel habits, loss of appetite, bloody stools.   Resp: No shortness of breath with exertion or at rest.  No excess mucus, no productive cough,  No non-productive cough,  No coughing up of blood.  No change in color of mucus.  No wheezing.  No chest wall deformity  Skin: no rash or lesions.  GU: no dysuria, change in color of urine, no urgency or frequency.  No flank pain, no hematuria   MS:  No joint pain or swelling.  No decreased range of motion.  No back pain.    Physical Exam  BP 128/72   Pulse 69   Ht 6' 1.5" (1.867 m)   Wt 205 lb 9.6 oz (93.3 kg)   SpO2 99%   BMI 26.76 kg/m   GEN: A/Ox3; pleasant , NAD, well nourished    HEENT:  [place]/AT,  NOSE-clear, THROAT-clear, no lesions, no postnasal drip or exudate noted.   NECK:  Supple w/ fair ROM; no JVD; normal carotid impulses w/o bruits; no thyromegaly or nodules palpated; no lymphadenopathy.    RESP  Clear  P & A; w/o, wheezes/ rales/ or rhonchi. no accessory muscle use, no dullness to percussion  CARD:  RRR, no m/r/g, no peripheral edema, pulses intact, no cyanosis or clubbing.  GI:   Soft & nt; nml bowel sounds; no organomegaly or masses detected.   Musco: Warm bil, no deformities or joint swelling noted.   Neuro: alert, no focal deficits noted.    Skin: Warm, no lesions or rashes    Lab Results:   BNP No results found for: "BNP"  ProBNP No results found for: "PROBNP"  Imaging: No results found.       Latest Ref Rng & Units 05/27/2022    8:25 AM  PFT Results  FVC-Pre L 3.02  P  FVC-Predicted Pre % 65  P  FVC-Post L 2.83  P  FVC-Predicted Post % 61  P  Pre FEV1/FVC % % 79  P  Post FEV1/FCV % % 80  P  FEV1-Pre L 2.38  P  FEV1-Predicted Pre % 72  P  FEV1-Post L 2.26  P  DLCO uncorrected ml/min/mmHg 16.67  P  DLCO UNC% % 62  P  DLCO corrected ml/min/mmHg 16.67  P  DLCO COR %Predicted % 62  P  DLVA Predicted % 83  P  TLC L 5.05  P  TLC % Predicted % 65  P   RV % Predicted % 72  P    P Preliminary result    No results found for: "NITRICOXIDE"      Assessment & Plan:   Asthma History of mild intermittent asthma well-controlled on Advair.  PFTs today show mild to moderate restriction.  Appears to be under good control.  Continue on current regimen Vaccines are up-to-date Asthma action plan discussed  Abnormal CT of the chest Multiple round thin-walled cysts throughout the lungs on high-resolution CT chest.  Questionable etiology. Unfortunately lab work last visit appears to have been lost.  Will repeat today.  Check autoimmune panel. PFT showed mild to moderate restriction and a decreased diffusing past.  Patient has minimum symptoms.  Will continue to follow conservatively.  Has a repeat high-res CT in May plan.  Plan  Patient Instructions  Activity as tolerated.  Continue on Advair  Albuterol inhaler as needed  CT chest in May as planned.  Labs today  Follow up with Dr Vaughan Browner in May after CT scan        Rexene Edison, NP 05/31/2022

## 2022-06-01 LAB — SJOGREN'S SYNDROME ANTIBODS(SSA + SSB)
SSA (Ro) (ENA) Antibody, IgG: <1 AI
SSB (La) (ENA) Antibody, IgG: <1 AI

## 2022-06-01 LAB — ANA: Anti Nuclear Antibody (ANA): NEGATIVE

## 2022-06-01 LAB — RHEUMATOID FACTOR: Rheumatoid fact SerPl-aCnc: <14 IU/mL (ref <14)

## 2022-06-03 NOTE — Telephone Encounter (Signed)
Called Chase Riley but she was unable at the time of call. Will call back in about 10-15 mins.

## 2022-06-03 NOTE — Telephone Encounter (Signed)
Called back and spoke with Chase Riley. I provided her with the date and time of collection of the VEGF test. She verbalized understanding.   Nothing further needed at time of call.

## 2022-06-03 NOTE — Telephone Encounter (Signed)
Calling back a/b the date and time for this pt test to be done [name]  she can be reached @ [phone], they have been waiting since Nov. They can not test sample until they have received the info they need.Chase Riley

## 2022-06-04 LAB — VEGF, SERUM: VEGF, Serum: 507 pg/mL (ref 62–707)

## 2022-06-07 DIAGNOSIS — Z4889 Encounter for other specified surgical aftercare: Secondary | ICD-10-CM | POA: Diagnosis not present

## 2022-06-07 NOTE — Progress Notes (Signed)
ATC x1.  LVM to return call. 

## 2022-06-08 ENCOUNTER — Other Ambulatory Visit: Payer: Self-pay

## 2022-06-08 ENCOUNTER — Telehealth: Payer: Self-pay | Admitting: Adult Health

## 2022-06-08 NOTE — Telephone Encounter (Signed)
Called and spoke to patient and let him know about his lab work results. He voiced understanding. Nothing further needed

## 2022-09-29 DIAGNOSIS — N1831 Chronic kidney disease, stage 3a: Secondary | ICD-10-CM | POA: Diagnosis not present

## 2022-09-29 DIAGNOSIS — E559 Vitamin D deficiency, unspecified: Secondary | ICD-10-CM | POA: Diagnosis not present

## 2022-09-29 DIAGNOSIS — I10 Essential (primary) hypertension: Secondary | ICD-10-CM | POA: Diagnosis not present

## 2022-09-29 DIAGNOSIS — E1122 Type 2 diabetes mellitus with diabetic chronic kidney disease: Secondary | ICD-10-CM | POA: Diagnosis not present

## 2022-09-29 DIAGNOSIS — M109 Gout, unspecified: Secondary | ICD-10-CM | POA: Diagnosis not present

## 2022-09-29 DIAGNOSIS — R7989 Other specified abnormal findings of blood chemistry: Secondary | ICD-10-CM | POA: Diagnosis not present

## 2022-09-29 DIAGNOSIS — E785 Hyperlipidemia, unspecified: Secondary | ICD-10-CM | POA: Diagnosis not present

## 2022-09-29 DIAGNOSIS — Z6827 Body mass index (BMI) 27.0-27.9, adult: Secondary | ICD-10-CM | POA: Diagnosis not present

## 2022-09-29 DIAGNOSIS — R5383 Other fatigue: Secondary | ICD-10-CM | POA: Diagnosis not present

## 2022-10-12 ENCOUNTER — Ambulatory Visit
Admission: RE | Admit: 2022-10-12 | Discharge: 2022-10-12 | Disposition: A | Discharge status: Home / Self Care | Payer: Medicare Other | Source: Ambulatory Visit | Attending: Pulmonary Disease | Admitting: Pulmonary Disease

## 2022-10-12 DIAGNOSIS — J849 Interstitial pulmonary disease, unspecified: Secondary | ICD-10-CM | POA: Diagnosis not present

## 2022-10-12 DIAGNOSIS — I700 Atherosclerosis of aorta: Secondary | ICD-10-CM | POA: Diagnosis not present

## 2022-11-16 DIAGNOSIS — U071 COVID-19: Secondary | ICD-10-CM | POA: Diagnosis not present

## 2022-11-16 DIAGNOSIS — Z6827 Body mass index (BMI) 27.0-27.9, adult: Secondary | ICD-10-CM | POA: Diagnosis not present

## 2022-12-12 ENCOUNTER — Ambulatory Visit (INDEPENDENT_AMBULATORY_CARE_PROVIDER_SITE_OTHER): Payer: Medicare Other | Admitting: Pulmonary Disease

## 2022-12-12 ENCOUNTER — Encounter: Payer: Self-pay | Admitting: Pulmonary Disease

## 2022-12-12 VITALS — BP 140/74 | HR 68 | Temp 97.7°F | Ht 73.5 in | Wt 208.6 lb

## 2022-12-12 DIAGNOSIS — J849 Interstitial pulmonary disease, unspecified: Secondary | ICD-10-CM

## 2022-12-12 NOTE — Progress Notes (Addendum)
Chase Riley    [id number]    1940/07/07  Primary Care Physician:System, Provider Not In  Referring Physician: No referring provider defined for this encounter.  Chief complaint: Consult for cystic lung disease   HPI: 82 y.o. 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  No ongoing exposures.  No hot tub, Jacuzzi.  No feather pillows or comforter ILD questionnaire 04/22/2022-negative Smoking history: Never smoker Travel history: Previously lived in [place], [place] Relevant family history: No significant family history of lung disease  Outpatient Encounter Medications as of 12/12/2022  Medication Sig   ADVAIR HFA 115-21 MCG/ACT inhaler Inhale 2 puffs into the lungs 2 (two) times daily.   albuterol (VENTOLIN HFA) 108 (90 Base) MCG/ACT inhaler Inhale 1-2 puffs into the lungs every 6 (six) hours as needed for wheezing or shortness of breath.   allopurinol (ZYLOPRIM) 100 MG tablet Take 100 mg by mouth in the morning and at bedtime.   olmesartan-hydrochlorothiazide (BENICAR HCT) 40-25 MG tablet Take 1 tablet by mouth daily.   rosuvastatin (CRESTOR) 40 MG tablet Take 40 mg by mouth daily.   RYBELSUS 3 MG TABS Take 3 mg by mouth daily in the afternoon.   No facility-administered encounter medications on file as of 12/12/2022.    Allergies as of 12/12/2022 - Review Complete 12/12/2022  Allergen Reaction Noted   Garlic Hives and Other (See Comments) 12/24/2021   Onion Hives and Other (See Comments) 12/30/2021    Past Medical History:  Diagnosis Date   Anemia     history of   Chronic kidney disease    Stage 4   Diabetes mellitus without complication (HCC)    type 2   Hypertension     Past Surgical History:  Procedure Laterality Date   KNEE ARTHROSCOPY Left 2016   LUMBAR LAMINECTOMY/DECOMPRESSION MICRODISCECTOMY Left 01/03/2022   Procedure: LUMBAR TWO THROUGH THREE DECOMPRESSION AND DISCECTOMY;  Surgeon: Venita Lick, MD;  Location: [facility] OR;  Service: Orthopedics;  Laterality: Left;  2.5 hrs 3 C-Bed   SHOULDER ARTHROSCOPY Left 2008    No family history on file.  Social History   Socioeconomic History   Marital status: Married    Spouse name: Not on file   Number of children: 2   Years of education: Not on file   Highest education level: Not on file  Occupational History   Not on file  Tobacco Use   Smoking status: Never   Smokeless tobacco: Never  Vaping Use   Vaping Use: Never used  Substance and Sexual Activity   Alcohol use: Never   Drug use: Never   Sexual activity: Not on file  Other Topics Concern   Not on file  Social History Narrative   Not on file   Social Determinants of Health   Financial Resource Strain: Not on file  Food Insecurity: Not on file  Transportation Needs: Not on file  Physical Activity: Not on file  Stress: Not on file  Social Connections: Not on file  Intimate Partner Violence: Not on file    Review of systems: Review of Systems  Constitutional: Negative for fever and chills.  HENT: Negative.   Eyes: Negative for blurred vision.  Respiratory: as per HPI  Cardiovascular: Negative for chest pain and palpitations.  Gastrointestinal: Negative for vomiting, diarrhea, blood per rectum. Genitourinary: Negative for dysuria, urgency, frequency and hematuria.  Musculoskeletal: Negative for myalgias, back pain and joint pain.  Skin: Negative for itching and rash.  Neurological: Negative for dizziness, tremors, focal weakness, seizures and loss of consciousness.  Endo/Heme/Allergies: Negative for  environmental allergies.  Psychiatric/Behavioral: Negative for depression, suicidal ideas and hallucinations.  All other systems reviewed and are negative.  Physical Exam: Blood pressure 138/62, pulse 64, temperature 97.7 F (36.5 C), temperature source Oral, height 6' 1.5" (1.867 m), weight 206 lb 4.8 oz (93.6 kg), SpO2 100 %. Gen:      No acute distress HEENT:  EOMI, sclera anicteric Neck:     No masses; no thyromegaly Lungs:    Clear to auscultation bilaterally; normal respiratory effort CV:         Regular rate and rhythm; no murmurs Abd:      + bowel sounds; soft, non-tender; no palpable masses, no distension Ext:    No edema; adequate peripheral perfusion Skin:      Warm and dry; no rash Neuro: alert and oriented x 3 Psych: normal mood and affect  Data Reviewed: Imaging: High-resolution CT 04/06/2022-cystic lung disease with multiple thin-walled cysts with no zonal distribution.  Mild bibasilar scarring.  I have reviewed the images personally.  High resolution CT 10/12/2022-stable cystic lung disease.  PFTs:   Labs: VEGF D levels 06/17/2022 at [facility]-437 pg/mL [reference range less than 600 pg/mL normal]  Assessment:  Evaluation for cystic lung disease CT findings are suggestive of LIP, LAM.  He does not have family history of skin lesions and kidney findings suggestive of [place] ANA, [place], [place], rheumatoid factor and VEGF levels are negative Consider genetic testing for  tuberous sclerosis complex, FLCN but patient would like to avoid  Patient would prefer conservative management and will get CT in 12 months  Asthma Stable on advair   Plan/Recommendations: CT in 12 months.  Chilton Greathouse MD [facility] 12/12/2022, 9:00 AM  CC: No ref. provider found

## 2022-12-12 NOTE — Patient Instructions (Signed)
I am glad you are doing better with your breathing.  Your CT scan is stable and we will continue to observe this Order high-res CT in 1 year ordered return to clinic in 1 year

## 2022-12-29 DIAGNOSIS — I700 Atherosclerosis of aorta: Secondary | ICD-10-CM | POA: Diagnosis not present

## 2022-12-29 DIAGNOSIS — R42 Dizziness and giddiness: Secondary | ICD-10-CM | POA: Diagnosis not present

## 2022-12-29 DIAGNOSIS — I10 Essential (primary) hypertension: Secondary | ICD-10-CM | POA: Diagnosis not present

## 2022-12-29 DIAGNOSIS — J84112 Idiopathic pulmonary fibrosis: Secondary | ICD-10-CM | POA: Diagnosis not present

## 2022-12-29 DIAGNOSIS — E559 Vitamin D deficiency, unspecified: Secondary | ICD-10-CM | POA: Diagnosis not present

## 2022-12-29 DIAGNOSIS — Z6827 Body mass index (BMI) 27.0-27.9, adult: Secondary | ICD-10-CM | POA: Diagnosis not present

## 2022-12-29 DIAGNOSIS — D573 Sickle-cell trait: Secondary | ICD-10-CM | POA: Diagnosis not present

## 2022-12-29 DIAGNOSIS — E1122 Type 2 diabetes mellitus with diabetic chronic kidney disease: Secondary | ICD-10-CM | POA: Diagnosis not present

## 2022-12-29 DIAGNOSIS — N1831 Chronic kidney disease, stage 3a: Secondary | ICD-10-CM | POA: Diagnosis not present

## 2023-02-27 DIAGNOSIS — Z23 Encounter for immunization: Secondary | ICD-10-CM | POA: Diagnosis not present

## 2023-03-05 IMAGING — CR DG CHEST 2V
2 series · 2 of 2 positions shown · non-contrast
Comparison: 09/10/2015

CLINICAL DATA: Chronic cough for 5 months.

EXAM:
CHEST - 2 VIEW

[w chest pa]
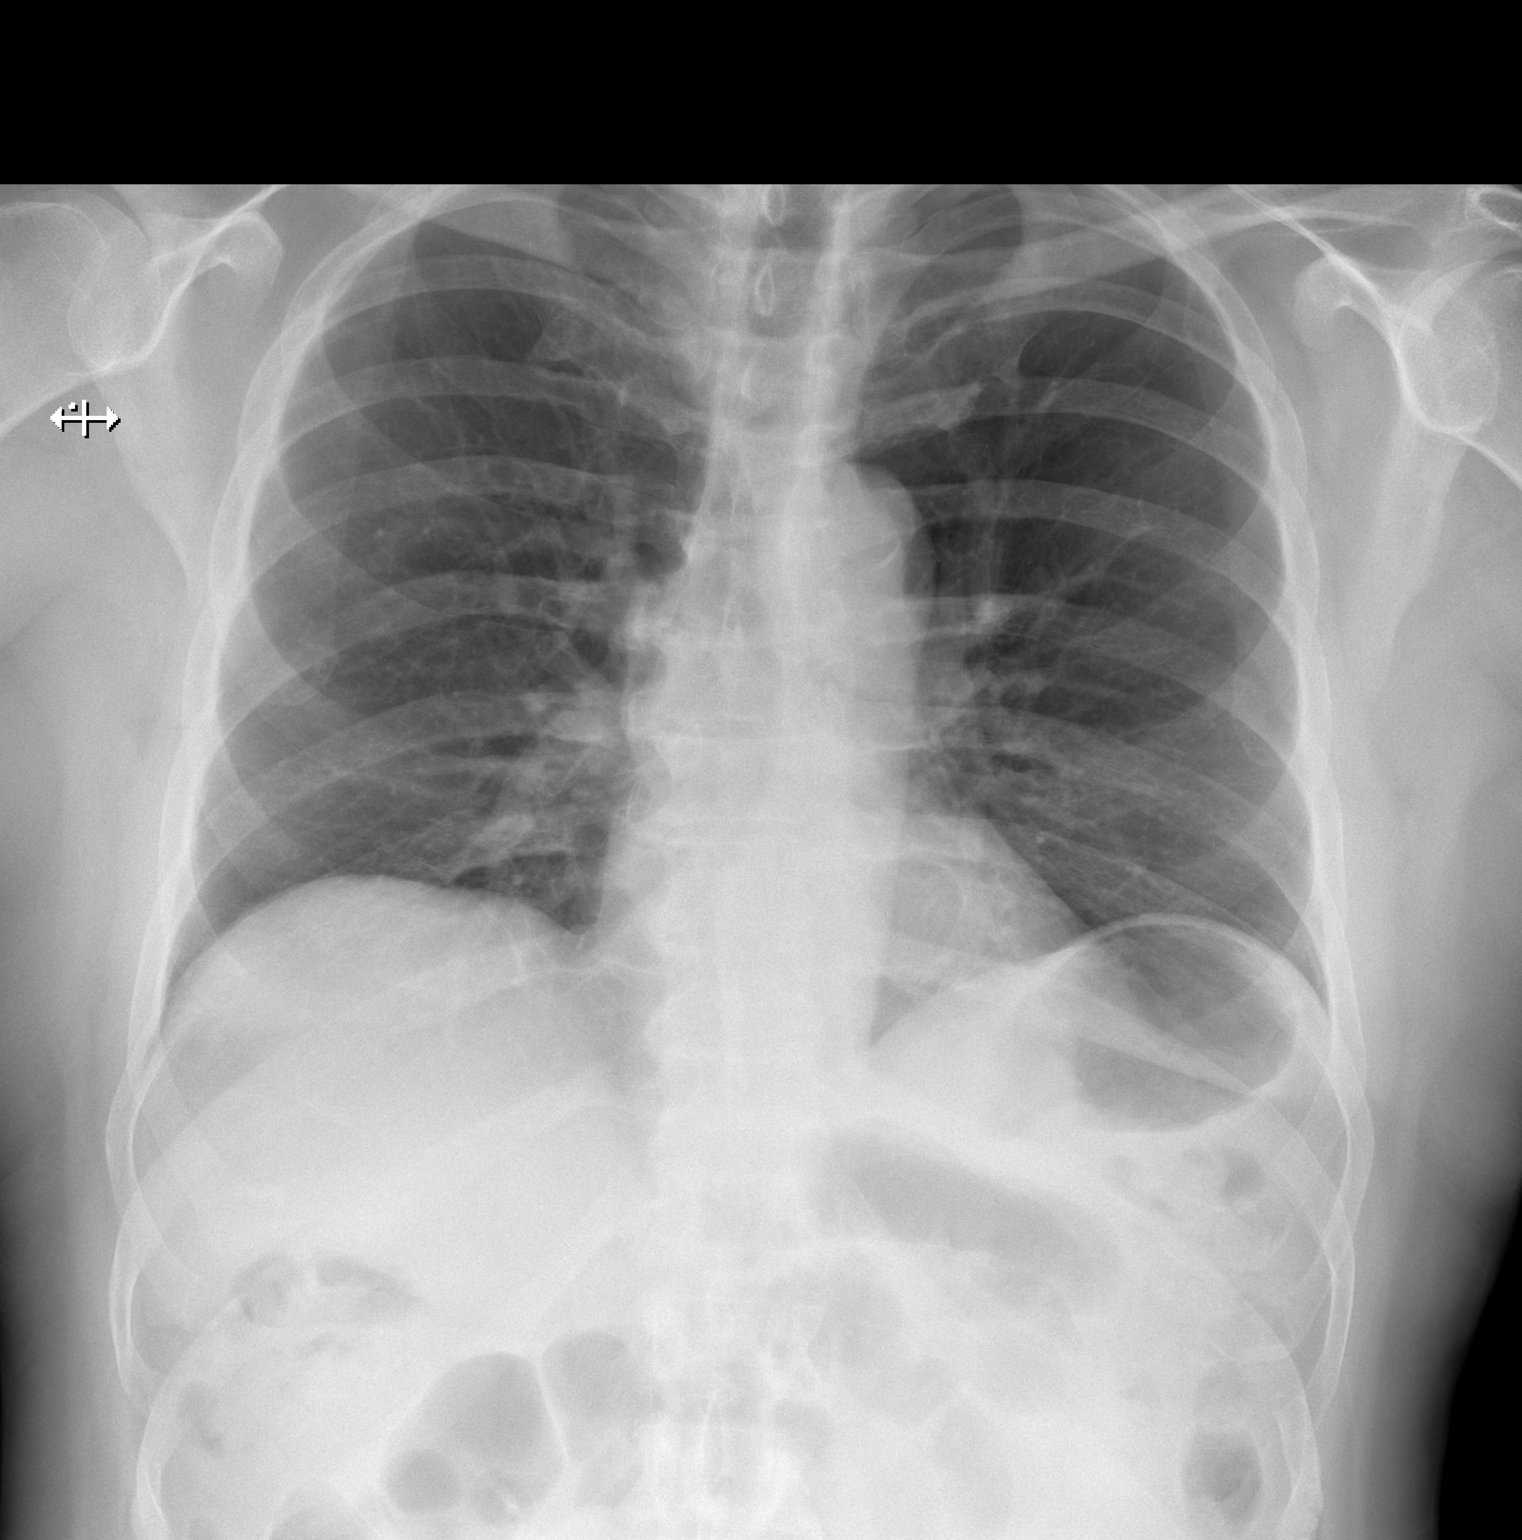

[w chest lat]
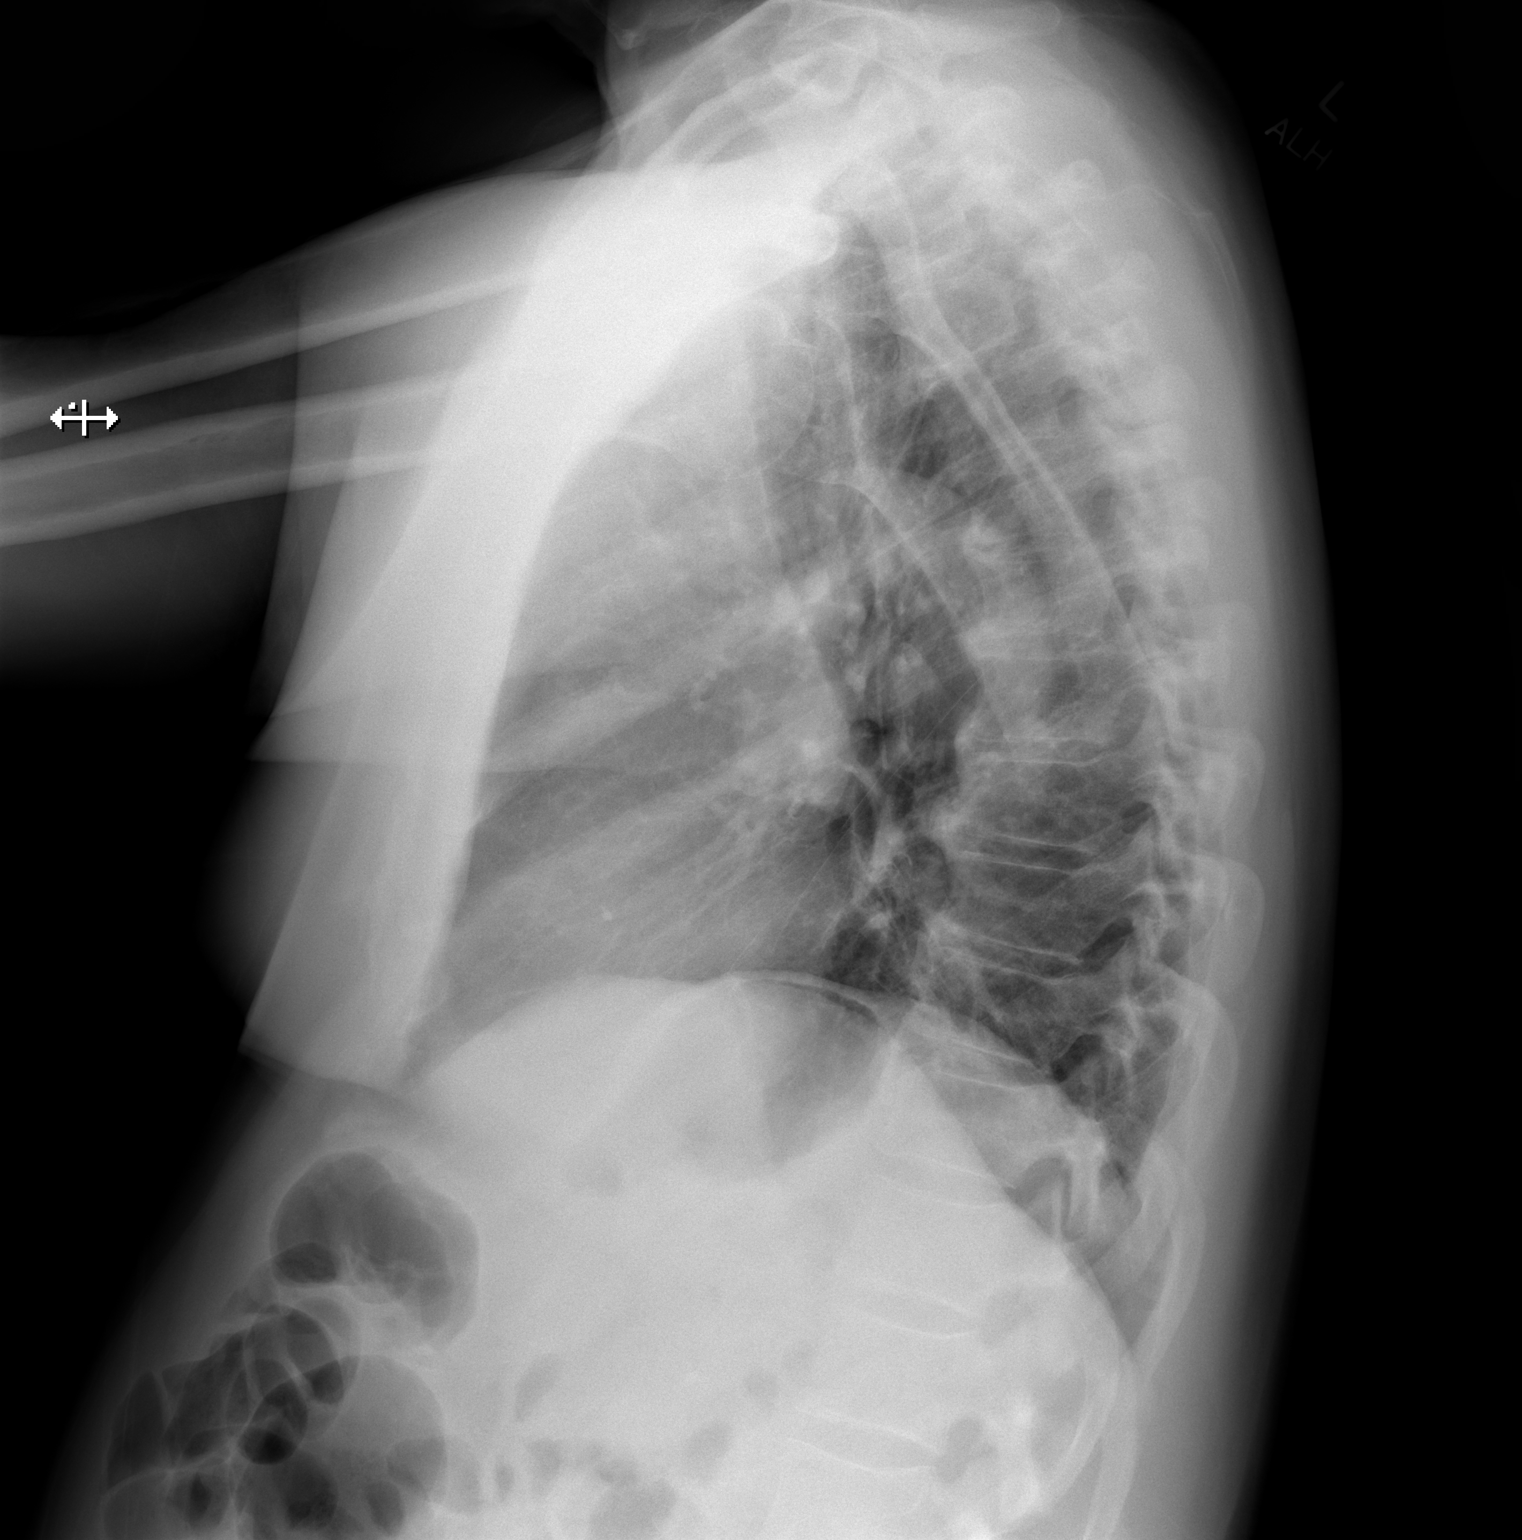

[2 of 2 positions shown; findings below may reference images not displayed]

FINDINGS: The heart size and mediastinal contours are within normal limits.
Aortic atherosclerotic calcification noted. Both lungs are clear.
The visualized skeletal structures are unremarkable.
IMPRESSION: No active cardiopulmonary disease.

## 2023-03-12 IMAGING — US US ABDOMEN COMPLETE
1 series · 13 of 25 positions shown · non-contrast
Comparison: None.

CLINICAL DATA: Elevated lipase.  Evaluate pancreas.

EXAM:
ABDOMEN ULTRASOUND COMPLETE

[Series 1: us abdomen complete · 0.19mm/px · 13 of 107 slices shown]
[im 1/107]
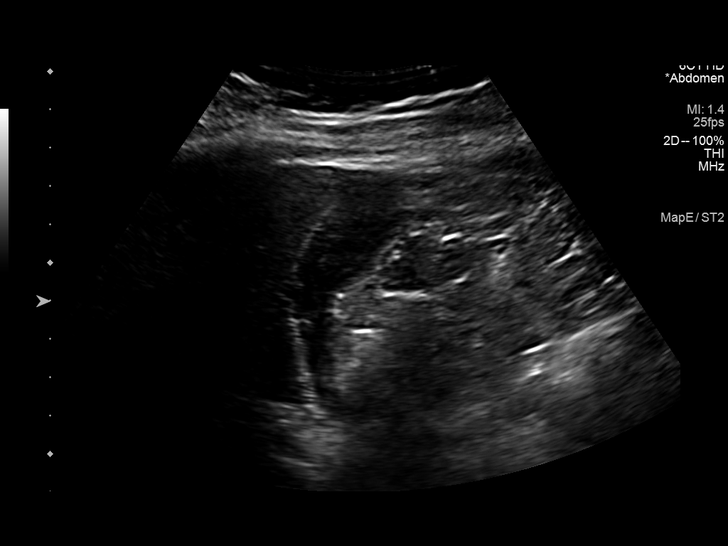
[im 9/107]
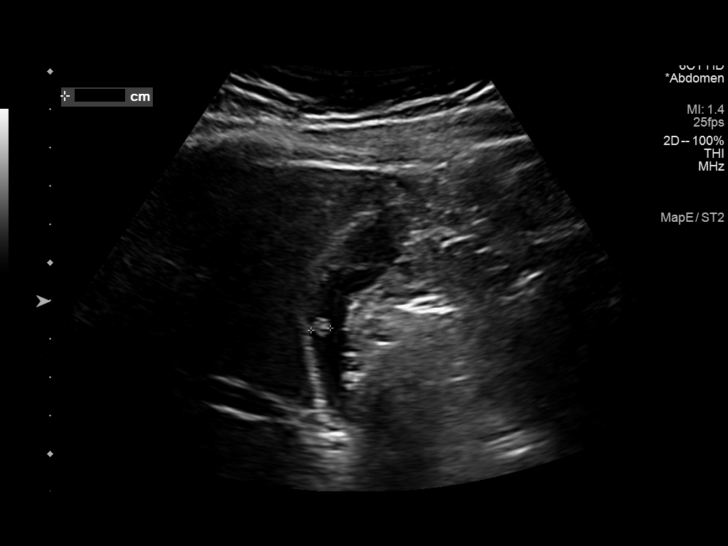
[im 18/107]
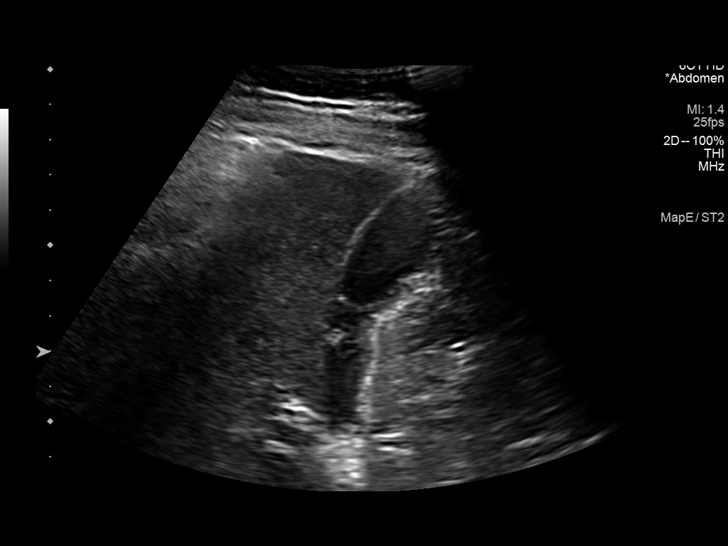
[im 27/107]
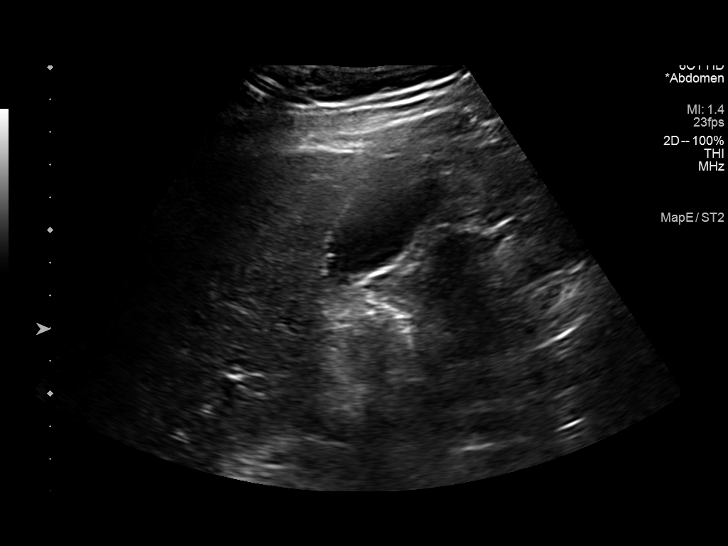
[im 36/107]
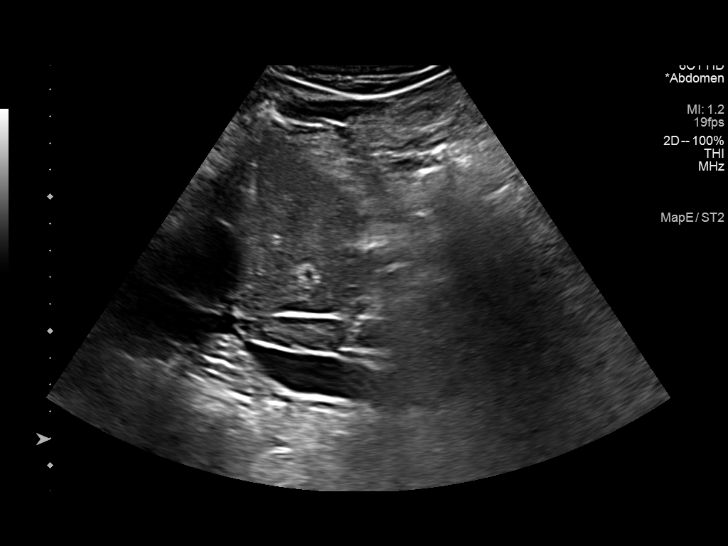
[im 45/107]
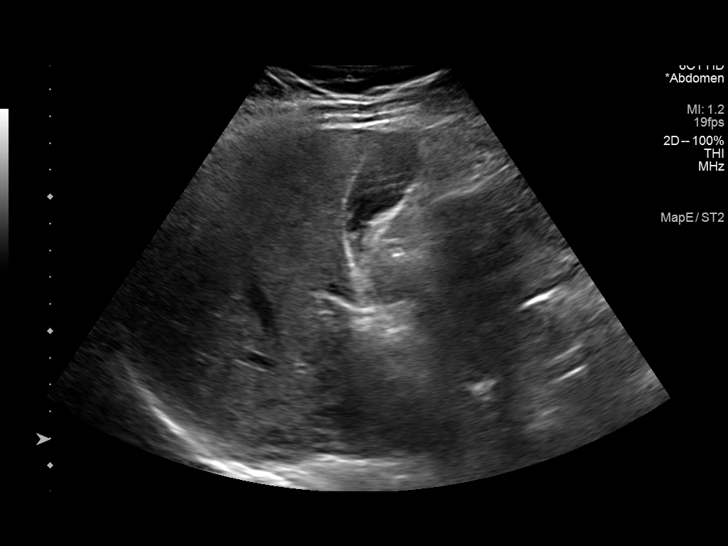
[im 54/107]
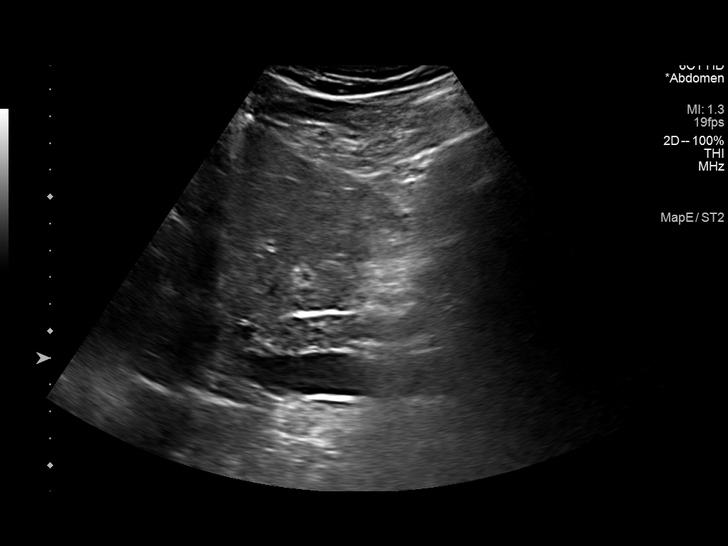
[im 62/107]
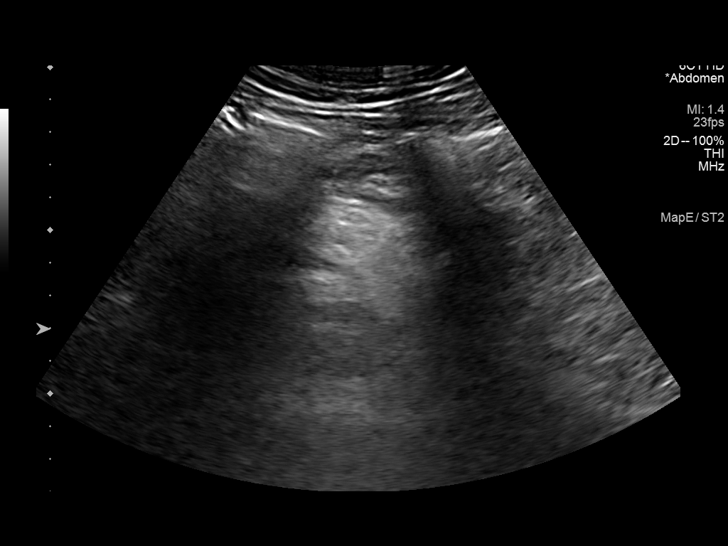
[im 71/107]
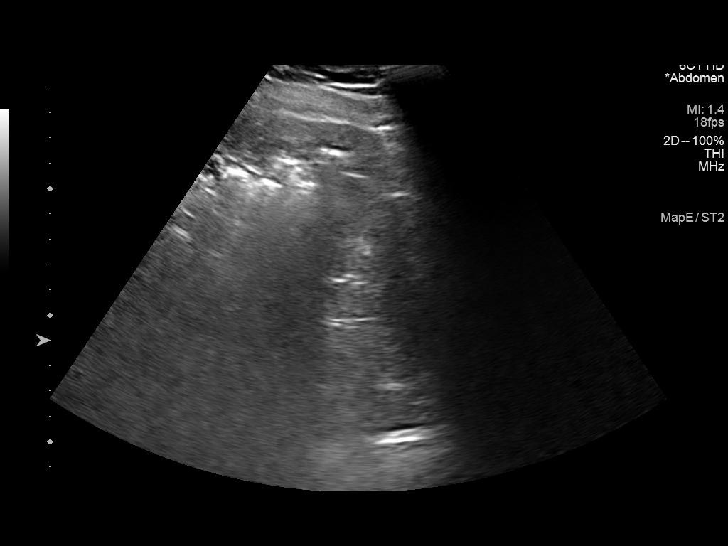
[im 80/107]
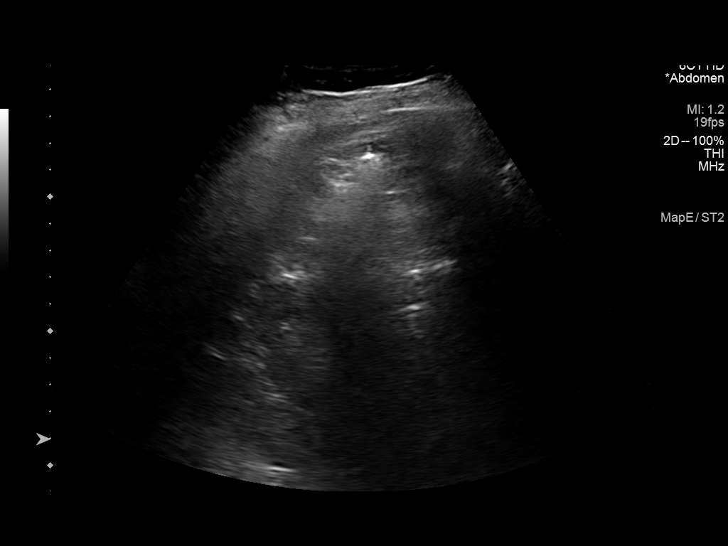
[im 89/107]
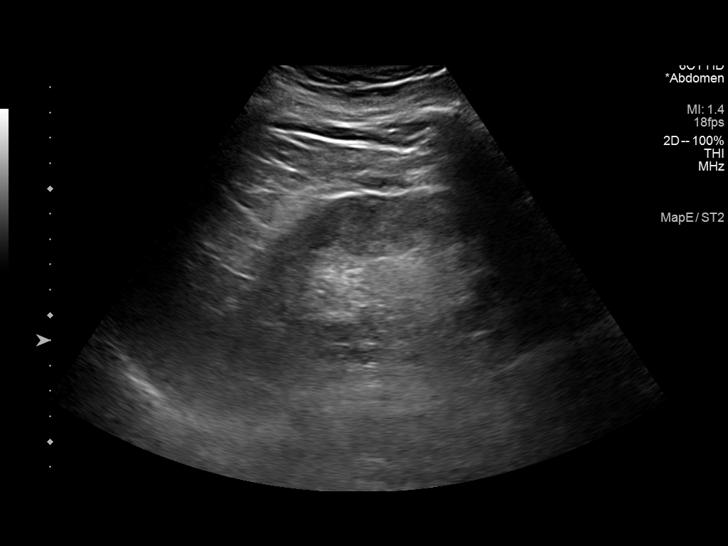
[im 98/107]
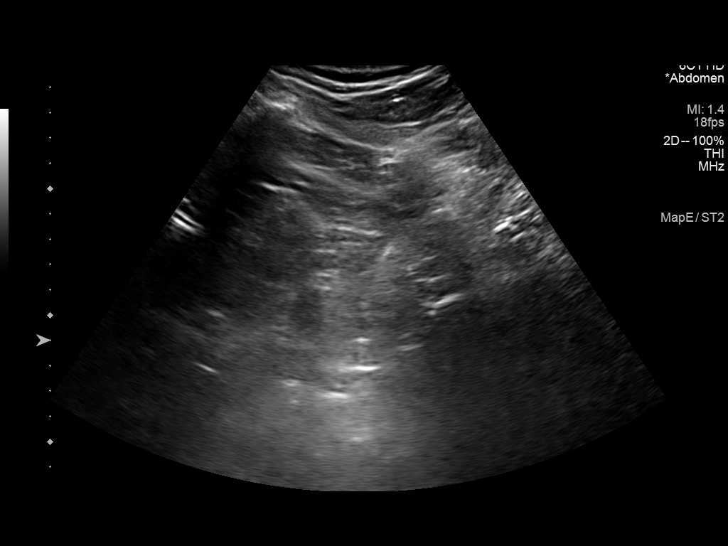
[im 107/107]
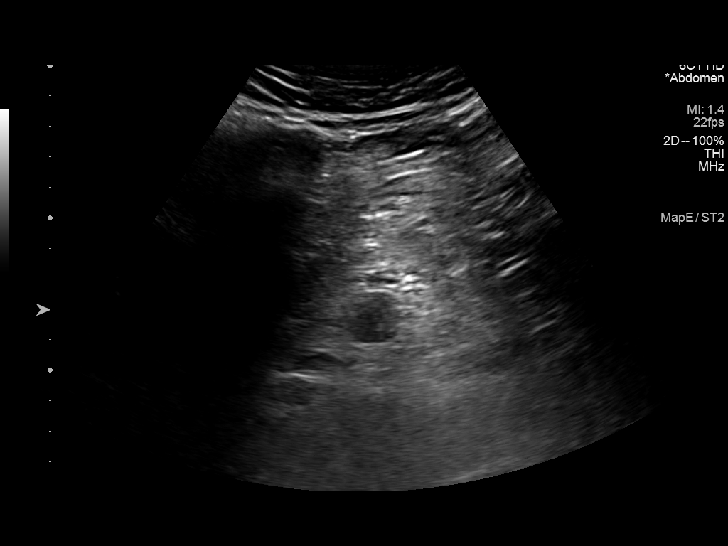

[13 of 25 positions shown; findings below may reference images not displayed]

FINDINGS: Gallbladder: Echogenic nonshadowing foci along the gallbladder wall
of maximally 5 mm. No wall thickening or pericholecystic fluid.
Sonographic Murphy's sign was not elicited.

Common bile duct: Diameter: Normal, 2 mm.

Liver: No focal lesion identified. Within normal limits in
parenchymal echogenicity. Portal vein is patent on color Doppler
imaging with normal direction of blood flow towards the liver.

IVC: No abnormality visualized.

Pancreas: Partially obscured by bowel gas. No gross abnormality
identified.

Spleen: Size and appearance within normal limits.

Right Kidney: Length: 9.6 cm. Normal echogenicity. No
hydronephrosis. 3.7 cm maximally inter/lower pole cyst or minimally
complex cyst.

Left Kidney: Length: 11.0 cm. Echogenicity within normal limits. No
mass or hydronephrosis visualized.

Abdominal aorta: No aneurysm visualized.

Other findings: Limited exam secondary to overlying bowel gas.
IMPRESSION: 1. Limited exam secondary to overlying bowel gas.
2. Echogenic nonshadowing foci along the gallbladder wall, favored
to represent small polyps. Given size, per consensus criteria these
can be presumed benign and imaging follow-up is not necessary. This
recommendation follows ACR consensus guidelines: White Paper of the
ACR Incidental Findings Committee II on Gallbladder and Biliary
Findings. [HOSPITAL] 7724:;[DATE].
3. Suboptimal evaluation of the pancreas. Consider dedicated CT or
MRI.

## 2023-09-05 DIAGNOSIS — Z0000 Encounter for general adult medical examination without abnormal findings: Secondary | ICD-10-CM | POA: Diagnosis not present

## 2023-09-05 DIAGNOSIS — E1122 Type 2 diabetes mellitus with diabetic chronic kidney disease: Secondary | ICD-10-CM | POA: Diagnosis not present

## 2023-09-05 DIAGNOSIS — Z1211 Encounter for screening for malignant neoplasm of colon: Secondary | ICD-10-CM | POA: Diagnosis not present

## 2023-09-05 DIAGNOSIS — J302 Other seasonal allergic rhinitis: Secondary | ICD-10-CM | POA: Diagnosis not present

## 2023-09-05 DIAGNOSIS — N1831 Chronic kidney disease, stage 3a: Secondary | ICD-10-CM | POA: Diagnosis not present

## 2023-09-05 DIAGNOSIS — J84112 Idiopathic pulmonary fibrosis: Secondary | ICD-10-CM | POA: Diagnosis not present

## 2023-09-05 DIAGNOSIS — E785 Hyperlipidemia, unspecified: Secondary | ICD-10-CM | POA: Diagnosis not present

## 2023-09-05 DIAGNOSIS — D573 Sickle-cell trait: Secondary | ICD-10-CM | POA: Diagnosis not present

## 2023-09-05 DIAGNOSIS — I10 Essential (primary) hypertension: Secondary | ICD-10-CM | POA: Diagnosis not present

## 2023-09-05 DIAGNOSIS — M109 Gout, unspecified: Secondary | ICD-10-CM | POA: Diagnosis not present

## 2023-09-14 DIAGNOSIS — K921 Melena: Secondary | ICD-10-CM | POA: Diagnosis not present

## 2023-09-14 DIAGNOSIS — D509 Iron deficiency anemia, unspecified: Secondary | ICD-10-CM | POA: Diagnosis not present

## 2023-09-19 DIAGNOSIS — H2513 Age-related nuclear cataract, bilateral: Secondary | ICD-10-CM | POA: Diagnosis not present

## 2023-09-19 DIAGNOSIS — H25011 Cortical age-related cataract, right eye: Secondary | ICD-10-CM | POA: Diagnosis not present

## 2023-09-19 DIAGNOSIS — H40023 Open angle with borderline findings, high risk, bilateral: Secondary | ICD-10-CM | POA: Diagnosis not present

## 2023-10-19 DIAGNOSIS — K5140 Inflammatory polyps of colon without complications: Secondary | ICD-10-CM | POA: Diagnosis not present

## 2023-10-19 DIAGNOSIS — K5730 Diverticulosis of large intestine without perforation or abscess without bleeding: Secondary | ICD-10-CM | POA: Diagnosis not present

## 2023-10-19 DIAGNOSIS — D509 Iron deficiency anemia, unspecified: Secondary | ICD-10-CM | POA: Diagnosis not present

## 2023-10-19 DIAGNOSIS — K2970 Gastritis, unspecified, without bleeding: Secondary | ICD-10-CM | POA: Diagnosis not present

## 2023-10-19 DIAGNOSIS — D128 Benign neoplasm of rectum: Secondary | ICD-10-CM | POA: Diagnosis not present

## 2023-10-19 DIAGNOSIS — K2930 Chronic superficial gastritis without bleeding: Secondary | ICD-10-CM | POA: Diagnosis not present

## 2023-10-23 DIAGNOSIS — K5140 Inflammatory polyps of colon without complications: Secondary | ICD-10-CM | POA: Diagnosis not present

## 2023-10-23 DIAGNOSIS — D128 Benign neoplasm of rectum: Secondary | ICD-10-CM | POA: Diagnosis not present

## 2023-10-23 DIAGNOSIS — K2930 Chronic superficial gastritis without bleeding: Secondary | ICD-10-CM | POA: Diagnosis not present

## 2023-12-11 ENCOUNTER — Ambulatory Visit
Admission: RE | Admit: 2023-12-11 | Discharge: 2023-12-11 | Disposition: A | Discharge status: Home / Self Care | Source: Ambulatory Visit | Attending: Pulmonary Disease | Admitting: Pulmonary Disease

## 2023-12-11 DIAGNOSIS — J8410 Pulmonary fibrosis, unspecified: Secondary | ICD-10-CM | POA: Diagnosis not present

## 2023-12-11 DIAGNOSIS — J849 Interstitial pulmonary disease, unspecified: Secondary | ICD-10-CM

## 2023-12-11 DIAGNOSIS — R911 Solitary pulmonary nodule: Secondary | ICD-10-CM | POA: Diagnosis not present

## 2023-12-18 ENCOUNTER — Ambulatory Visit: Payer: Self-pay | Admitting: Pulmonary Disease

## 2023-12-18 DIAGNOSIS — J849 Interstitial pulmonary disease, unspecified: Secondary | ICD-10-CM

## 2023-12-18 DIAGNOSIS — R9389 Abnormal findings on diagnostic imaging of other specified body structures: Secondary | ICD-10-CM

## 2023-12-21 DIAGNOSIS — H25011 Cortical age-related cataract, right eye: Secondary | ICD-10-CM | POA: Diagnosis not present

## 2023-12-21 DIAGNOSIS — E119 Type 2 diabetes mellitus without complications: Secondary | ICD-10-CM | POA: Diagnosis not present

## 2023-12-21 DIAGNOSIS — H2513 Age-related nuclear cataract, bilateral: Secondary | ICD-10-CM | POA: Diagnosis not present

## 2023-12-21 DIAGNOSIS — H40023 Open angle with borderline findings, high risk, bilateral: Secondary | ICD-10-CM | POA: Diagnosis not present

## 2024-01-04 ENCOUNTER — Encounter: Payer: Self-pay | Admitting: Pulmonary Disease

## 2024-01-04 ENCOUNTER — Ambulatory Visit: Admitting: Pulmonary Disease

## 2024-01-04 VITALS — BP 140/74 | HR 74 | Temp 98.5°F | Ht 73.0 in | Wt 204.0 lb

## 2024-01-04 DIAGNOSIS — J984 Other disorders of lung: Secondary | ICD-10-CM

## 2024-01-04 DIAGNOSIS — R9389 Abnormal findings on diagnostic imaging of other specified body structures: Secondary | ICD-10-CM | POA: Diagnosis not present

## 2024-01-04 DIAGNOSIS — J4520 Mild intermittent asthma, uncomplicated: Secondary | ICD-10-CM

## 2024-01-04 DIAGNOSIS — J45909 Unspecified asthma, uncomplicated: Secondary | ICD-10-CM | POA: Diagnosis not present

## 2024-01-04 DIAGNOSIS — J849 Interstitial pulmonary disease, unspecified: Secondary | ICD-10-CM

## 2024-01-04 NOTE — Patient Instructions (Signed)
 VISIT SUMMARY:  You came in today for a follow-up on your cystic lung disease and to discuss the results of your recent CT scan. We also talked about your mild intermittent chest congestion.  YOUR PLAN:  -CYSTIC LUNG DISEASE (POSSIBLE LIP OR LAM) WITH PULMONARY MICRONODULES: Cystic lung disease involves the formation of cysts in the lungs, which can be associated with conditions like lymphocytic interstitial pneumonia (LIP) or lymphangioleiomyomatosis (LAM). Your recent CT scan showed small micronodules, but your condition is stable and does not require invasive procedures at this time. We will monitor your condition with another CT scan in one year.  -MILD INTERMITTENT CHEST CONGESTION: Your mild intermittent chest congestion occurs mainly when you move from outside to an air-conditioned environment. This congestion is temporary and not significantly bothersome. No specific treatment is needed at this time.  INSTRUCTIONS:  Please schedule a follow-up CT scan in one year to monitor your cystic lung disease and pulmonary micronodules.

## 2024-01-04 NOTE — Progress Notes (Signed)
 Chase Riley    [id number]    07-05-1940  Primary Care Physician:System, Provider Not In  Referring Physician: No referring provider defined for this encounter.  Chief complaint: Follow-up for cystic lung disease   HPI: 83 y.o. with past medical history of diabetes, hypertension, hyperlipidemia, gout, stage III chronic kidney disease, asthma  He had an incidental finding of lung cyst found on CT abdomen pelvis and had a high-resolution CT done and referred here for evaluation States that his breathing is stable with no complaints  Interim History: The patient is an 83 year old with cystic lung disease who presents for follow-up of an abnormal CT scan.  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  No ongoing exposures.  No hot tub, Jacuzzi.  No feather pillows or comforter ILD questionnaire 04/22/2022-negative Smoking history: Never smoker Travel history: Previously lived in [place] , [place]  Relevant family history: No significant family history of lung disease  Outpatient  Encounter Medications as of 01/04/2024  Medication Sig   ADVAIR HFA 115-21 MCG/ACT inhaler Inhale 2 puffs into the lungs 2 (two) times daily.   albuterol  (VENTOLIN  HFA) 108 (90 Base) MCG/ACT inhaler Inhale 1-2 puffs into the lungs every 6 (six) hours as needed for wheezing or shortness of breath.   allopurinol  (ZYLOPRIM ) 100 MG tablet Take 100 mg by mouth in the morning and at bedtime.   olmesartan -hydrochlorothiazide  (BENICAR  HCT) 40-25 MG tablet Take 1 tablet by mouth daily.   rosuvastatin  (CRESTOR ) 40 MG tablet Take 40 mg by mouth daily.   RYBELSUS  3 MG TABS Take 3 mg by mouth daily in the afternoon.   No facility-administered encounter medications on file as of 01/04/2024.    Physical Exam: Blood pressure (!) 142/88, pulse 74, temperature 98.5 F (36.9 C), temperature source Oral, height 6' 1 (1.854 m), weight 204 lb (92.5 kg), SpO2 96%. Gen:      No acute distress HEENT:  EOMI, sclera anicteric Neck:     No masses; no thyromegaly Lungs:    Clear to auscultation bilaterally; normal respiratory effort CV:         Regular rate and rhythm; no murmurs Abd:      + bowel sounds; soft, non-tender; no palpable masses, no distension Ext:    No edema; adequate peripheral perfusion Neuro: alert and oriented x 3 Psych: normal mood and affect   Data Reviewed: Imaging: High-resolution CT 04/06/2022-cystic lung disease with multiple thin-walled cysts with no zonal distribution.  Mild bibasilar scarring.  I have reviewed the images personally.  High resolution CT 10/12/2022-stable cystic lung disease.  High resolution CT 12/11/2023-stable appearance of cystic lung disease.micronodule, interval increase in groundglass opacities in the right lower lobe. I had reviewed images personally.  PFTs: 05/27/2022 FVC  2.83 (61%), FEV1 2.26 [68%], F/F 80, TLC 5.05 [65%], DLCO 16.67 [62%] Moderate restriction, moderate diffusion defect  Labs: VEGF D levels 06/17/2022 at [facility]-437  pg/mL [reference range less than 600 pg/mL normal]  Assessment:  Evaluation for cystic lung disease CT findings are suggestive of LIP, LAM.  He does not have family history of skin lesions and kidney findings suggestive of [name] ANA, [place], [place], rheumatoid factor and VEGF levels are negative Consider genetic testing for  tuberous sclerosis complex, FLCN but patient would like to avoid Lung biopsy not indicated due to stable condition and preference to avoid invasive procedures.  Last CT scan shows nonspecific increase in groundglass opacity in the right lower lobe.  - Order follow-up CT scan in one year to monitor cystic lung disease and pulmonary micronodules, groundglass opacities  Asthma Stable on advair  Plan/Recommendations: CT in 12 months.  Lonna Coder MD [facility] 01/04/2024, 3:13 PM  CC: No ref. provider found

## 2024-02-19 DIAGNOSIS — Z23 Encounter for immunization: Secondary | ICD-10-CM | POA: Diagnosis not present

## 2024-04-30 DIAGNOSIS — N1831 Chronic kidney disease, stage 3a: Secondary | ICD-10-CM | POA: Diagnosis not present

## 2024-04-30 DIAGNOSIS — E785 Hyperlipidemia, unspecified: Secondary | ICD-10-CM | POA: Diagnosis not present

## 2024-04-30 DIAGNOSIS — D573 Sickle-cell trait: Secondary | ICD-10-CM | POA: Diagnosis not present

## 2024-04-30 DIAGNOSIS — L989 Disorder of the skin and subcutaneous tissue, unspecified: Secondary | ICD-10-CM | POA: Diagnosis not present

## 2024-04-30 DIAGNOSIS — E559 Vitamin D deficiency, unspecified: Secondary | ICD-10-CM | POA: Diagnosis not present

## 2024-04-30 DIAGNOSIS — I10 Essential (primary) hypertension: Secondary | ICD-10-CM | POA: Diagnosis not present

## 2024-04-30 DIAGNOSIS — D509 Iron deficiency anemia, unspecified: Secondary | ICD-10-CM | POA: Diagnosis not present

## 2024-04-30 DIAGNOSIS — R143 Flatulence: Secondary | ICD-10-CM | POA: Diagnosis not present

## 2024-04-30 DIAGNOSIS — E1122 Type 2 diabetes mellitus with diabetic chronic kidney disease: Secondary | ICD-10-CM | POA: Diagnosis not present
